# Patient Record
Sex: Female | Born: 1970 | Race: Black or African American | Hispanic: No | Marital: Married | Smoking: Former smoker
Health system: Southern US, Community
[De-identification: ages and names within clinical notes are randomized; demographics above are authoritative.]

## PROBLEM LIST (undated history)

## (undated) DIAGNOSIS — G40909 Epilepsy, unspecified, not intractable, without status epilepticus: Secondary | ICD-10-CM

## (undated) DIAGNOSIS — R569 Unspecified convulsions: Secondary | ICD-10-CM

## (undated) HISTORY — DX: Epilepsy, unspecified, not intractable, without status epilepticus: G40.909

## (undated) HISTORY — DX: Unspecified convulsions: R56.9

## (undated) HISTORY — PX: OTHER SURGICAL HISTORY: SHX169

---

## 2004-01-30 ENCOUNTER — Emergency Department (HOSPITAL_COMMUNITY): Admission: EM | Admit: 2004-01-30 | Discharge: 2004-01-30 | Payer: Self-pay | Admitting: Emergency Medicine

## 2005-04-19 IMAGING — CT CT HEAD W/O CM
1 of 2 series · 13 of 30 positions shown, 17 images · non-contrast
Comparison: none

CLINICAL DATA: New onset of seizure.
 CT BRAIN SCAN WITHOUT CONTRAST ? 01/30/04
 Moderate atrophy.  The cerebral and cerebellar parenchyma is symmetric in appearance.  The ventricles and basilar cisterns are midline without mass effect.  Negative for extra-axial fluid collections.  The mastoid air cells and sinuses are clear. 
 IMPRESSION
 Mild atrophy without acute intracranial hemorrhage or edema.

[Series 3: — · axial · 0.43mm/px · z∈[-170,-60]mm · 13 of 26 slices shown, 17 images]
[im 2/26  brain]
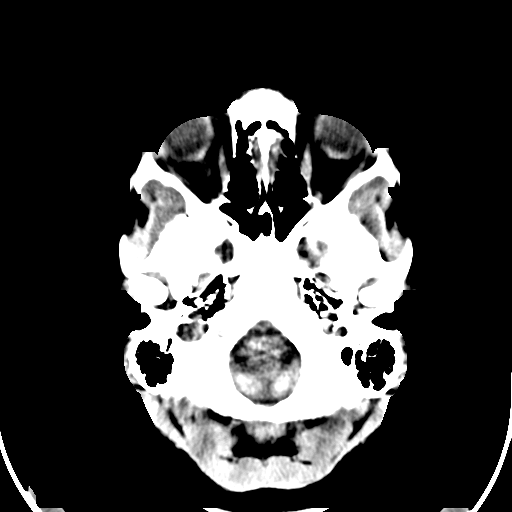
[im 2/26  bone]
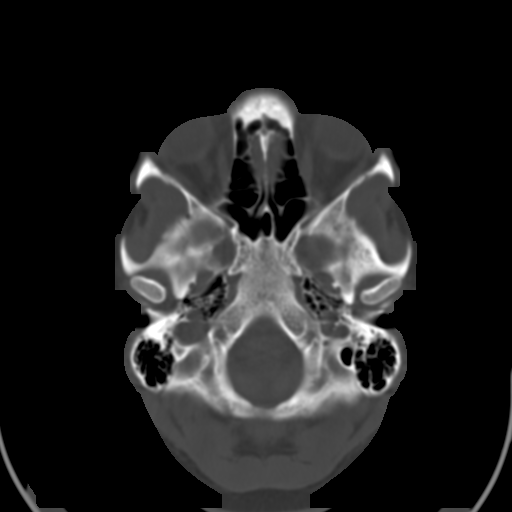
[im 4/26  brain]
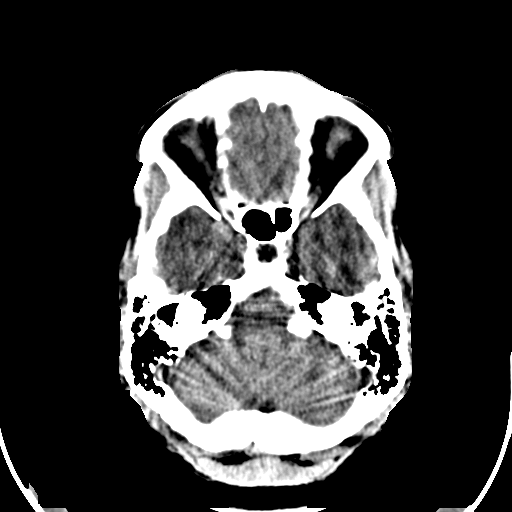
[im 6/26  brain]
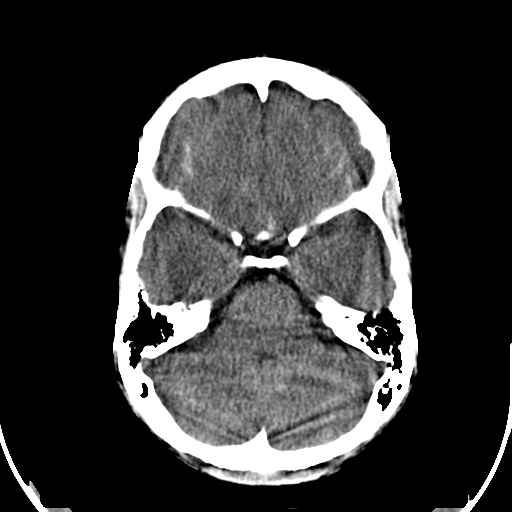
[im 8/26  brain]
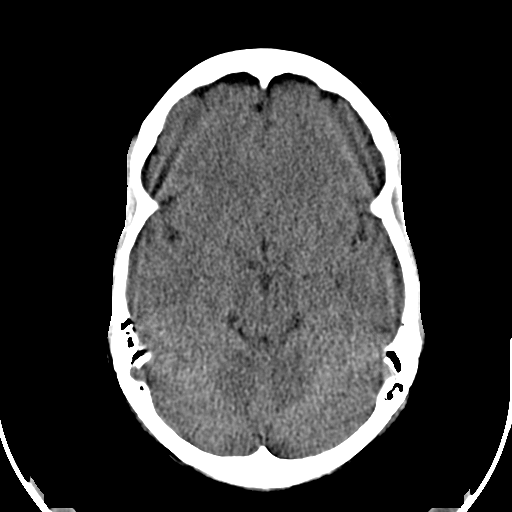
[im 9/26  brain]
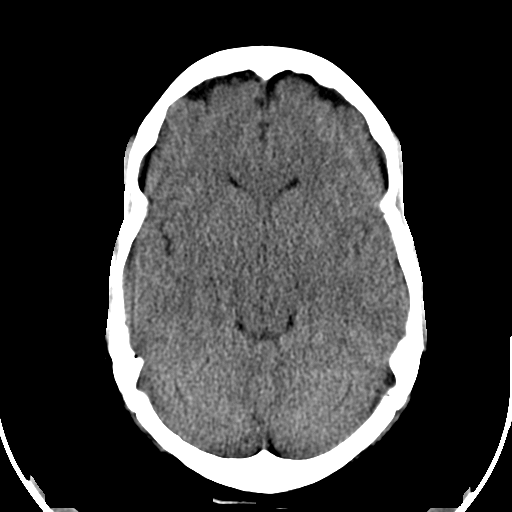
[im 9/26  bone]
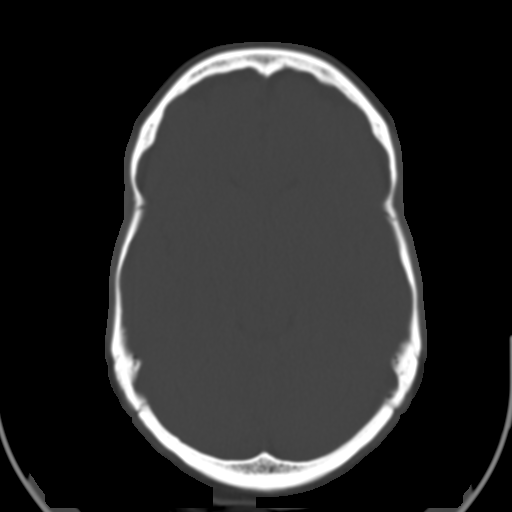
[im 11/26  brain]
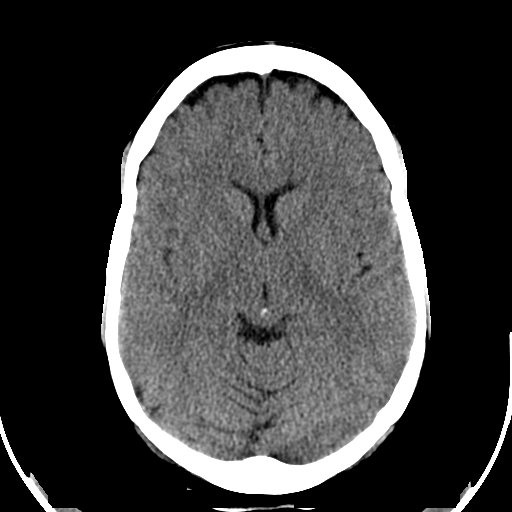
[im 13/26  brain]
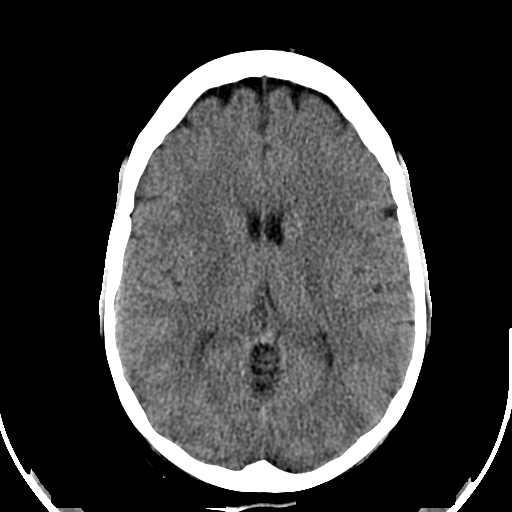
[im 15/26  brain]
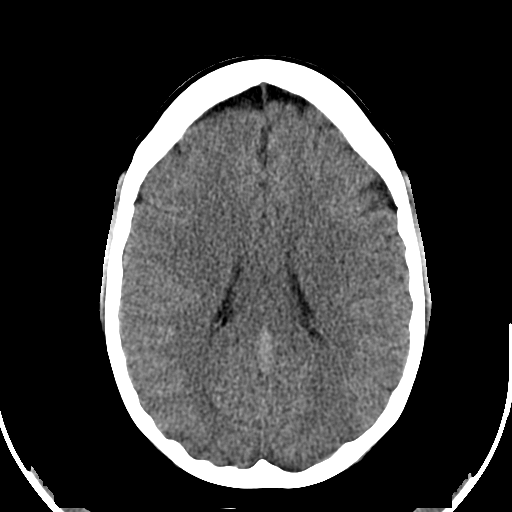
[im 17/26  brain]
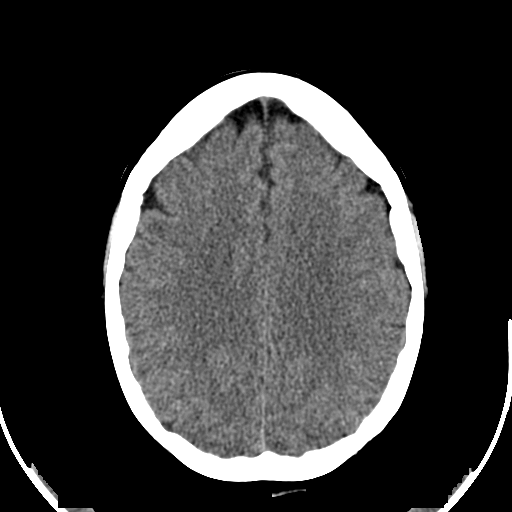
[im 17/26  bone]
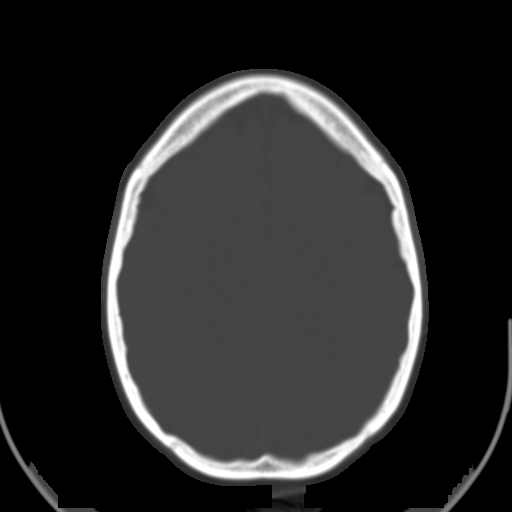
[im 18/26  brain]
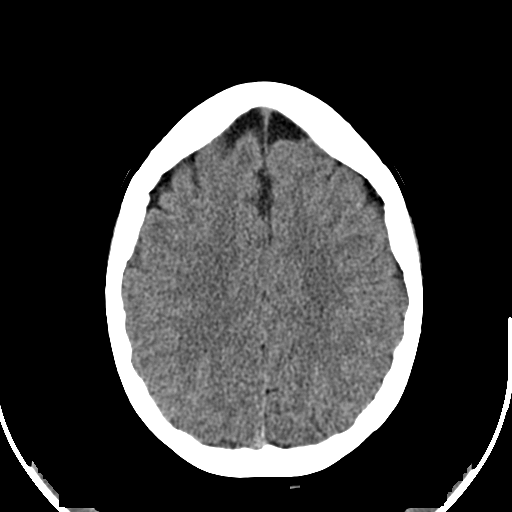
[im 20/26  brain]
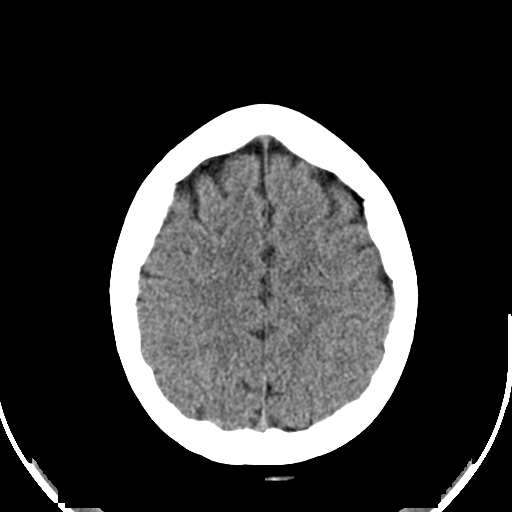
[im 22/26  brain]
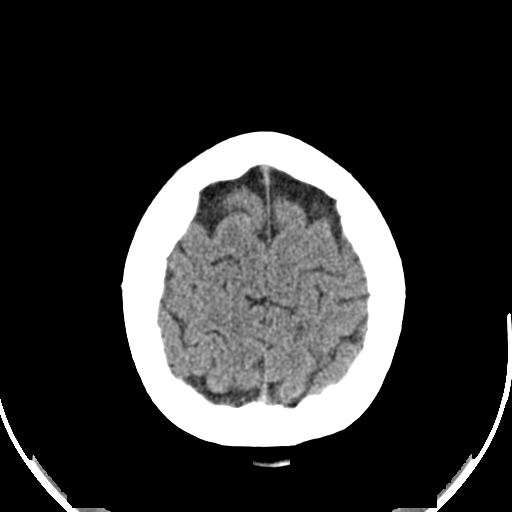
[im 24/26  brain]
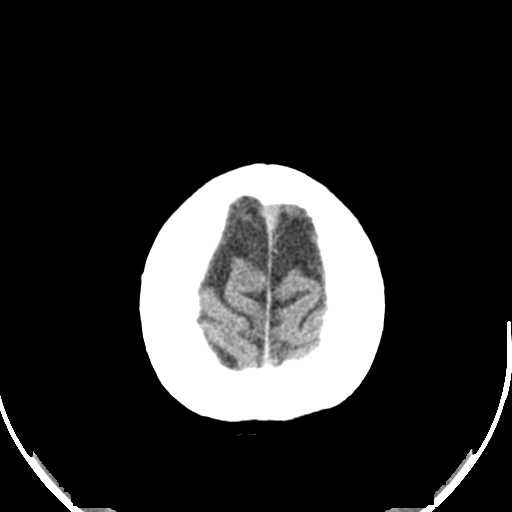
[im 24/26  bone]
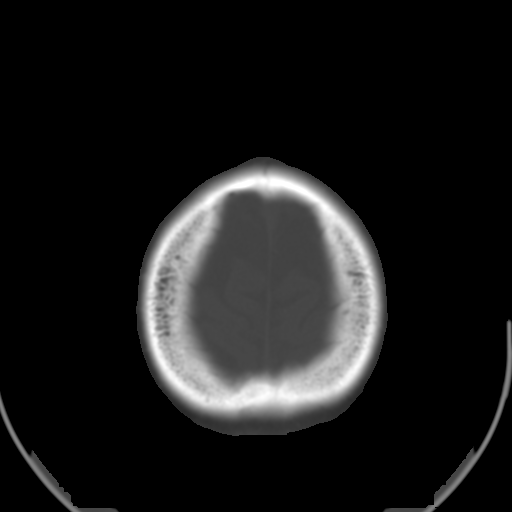

[13 of 30 positions shown; findings below may reference images not displayed]

## 2013-05-15 ENCOUNTER — Encounter: Payer: Self-pay | Admitting: Nurse Practitioner

## 2013-05-19 ENCOUNTER — Telehealth: Payer: Self-pay | Admitting: Nurse Practitioner

## 2013-05-19 NOTE — Telephone Encounter (Signed)
left message reminding patient of appointment. wed 05/21/13,couldn't reach patient wrong number,

## 2013-05-21 ENCOUNTER — Encounter: Payer: Self-pay | Admitting: Nurse Practitioner

## 2013-05-21 ENCOUNTER — Encounter (INDEPENDENT_AMBULATORY_CARE_PROVIDER_SITE_OTHER): Payer: Self-pay

## 2013-05-21 ENCOUNTER — Ambulatory Visit (INDEPENDENT_AMBULATORY_CARE_PROVIDER_SITE_OTHER): Payer: 59 | Admitting: Nurse Practitioner

## 2013-05-21 VITALS — BP 109/76 | HR 91 | Temp 97.5°F | Ht 66.0 in | Wt 186.0 lb

## 2013-05-21 DIAGNOSIS — Z79899 Other long term (current) drug therapy: Secondary | ICD-10-CM | POA: Insufficient documentation

## 2013-05-21 DIAGNOSIS — G40209 Localization-related (focal) (partial) symptomatic epilepsy and epileptic syndromes with complex partial seizures, not intractable, without status epilepticus: Secondary | ICD-10-CM

## 2013-05-21 DIAGNOSIS — G40309 Generalized idiopathic epilepsy and epileptic syndromes, not intractable, without status epilepticus: Secondary | ICD-10-CM

## 2013-05-21 MED ORDER — TOPIRAMATE 200 MG PO TABS: 200.0000 mg | ORAL_TABLET | Freq: Two times a day (BID) | ORAL | Status: DC

## 2013-05-21 NOTE — Patient Instructions (Signed)
Will check labs today Will reorder Depakote after labs back Will refill Topamax F/U yearly

## 2013-05-21 NOTE — [facility] Notes (Signed)
[facility]  PATIENT: Jacqueline [name] DOB: 15-Jan-1971   REASON FOR VISIT: Followup for seizure disorder   HISTORY OF PRESENT ILLNESS: Jacqueline [name], 42 year old female returns for followup. She was last seen on 05/22/2012. 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 MRI in the past  normal. She has had spells of funny sensations where she will lose consciousness for about 10-15 seconds. Depakote was added to her regimen in February of 2009 and increased over the ensuing months.  Her last seizure event occurred in October of 2009. She denies missing any doses of her medication, she denies sleep deprivation,  she has had no falls.  At last visit her valproic acid level was elevated and her Depakote was decreased to 1500 daily however she is only taking 1000 mg daily.. Will check level today. No primary care provider No new neurological complaints.   REVIEW OF SYSTEMS: Full 14 system review of systems performed and notable only for: all negative Constitutional: N/A  Cardiovascular: N/A  Ear/Nose/Throat: N/A  Skin: N/A  Eyes: N/A  Respiratory: N/A  Gastroitestinal: N/A  Hematology/Lymphatic: N/A  Endocrine: N/A Musculoskeletal:N/A  Allergy/Immunology: N/A  Neurological: N/A Psychiatric: N/A   ALLERGIES: No Known Allergies  HOME MEDICATIONS: Outpatient Prescriptions Prior to Visit  Medication Sig Dispense Refill  . divalproex (DEPAKOTE ER) 500 MG 24 hr tablet Take 1,000 mg by mouth daily.       Marland Kitchen topiramate (TOPAMAX) 200 MG tablet Take 200 mg by mouth 2 (two) times daily.       No facility-administered medications prior to visit.    PAST MEDICAL HISTORY: Past Medical History  Diagnosis Date  . Seizure disorder     PAST SURGICAL HISTORY: Past Surgical History  Procedure Laterality Date  . Broken nose      in high school    FAMILY HISTORY: Family History  Problem Relation Age of Onset  . Diabetes Father     SOCIAL  HISTORY: History   Social History  . Marital Status: Married    Spouse Name: [name]    Number of Children: 2  . Years of Education: college   Occupational History  . Inventory control     [employer]   Social History Main Topics  . Smoking status: Current Every Day Smoker -- 0.50 packs/day    Types: Cigarettes  . Smokeless tobacco: Never Used  . Alcohol Use: No  . Drug Use: No  . Sexual Activity: Not on file   Other Topics Concern  . Not on file   Social History Narrative   [name] and lives at home with her family.   Patient has 2 children.   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   Patient has a college education.   Patient drinks 1 cup of caffeine daily.           PHYSICAL EXAM  Filed Vitals:   05/21/13 1003  BP: 109/76  Pulse: 91  Temp: 97.5 F (36.4 C)  TempSrc: Oral  Height: 5\' 6"  (1.676 m)  Weight: 186 lb (84.369 kg)   Body mass index is 30.04 kg/(m^2).  Generalized: Well developed, in no acute distress   Neurological examination   Mentation: Alert oriented to time, place, history taking. Follows all commands speech and language fluent  Cranial nerve II-XII: Pupils were equal round reactive to light extraocular movements were full, visual field were full on confrontational test. Facial sensation and strength were normal. hearing  was intact to finger rubbing bilaterally. Uvula tongue midline. head turning and shoulder shrug and were normal and symmetric.Tongue protrusion into cheek strength was normal. Motor: normal bulk and tone, full strength in the BUE, BLE, fine finger movements normal, no pronator drift. No focal weakness Coordination: finger-nose-finger, heel-to-shin bilaterally, no dysmetria Reflexes: Brachioradialis 2/2, biceps 2/2, triceps 2/2, patellar 2/2, Achilles 2/2, plantar responses were flexor bilaterally. Gait and Station: Rising up from seated position without assistance, normal stance,   moderate stride, good arm swing, smooth turning, able to perform tiptoe, and heel walking without difficulty. Tandem gait steady  DIAGNOSTIC DATA (LABS, IMAGING, TESTING) - None to review   ASSESSMENT AND PLAN  42 y.o. year old female  has a past medical history of Seizure disorder. here to followup. No seizure activity since 2009. Last VPA level was elevated and patient has to decrease Depakote to 1500 mg daily. She is only taking 1000 mg daily. She remains on Topamax 200 mg twice daily  Will check labs today, CBC, CMP, VPA level Will reorder Depakote after labs back Will refill Topamax F/U yearly Jacqueline [name], [facility], [facility], APRN  [facility] [address] [place], [place] [address] [phone]

## 2013-05-22 ENCOUNTER — Other Ambulatory Visit: Payer: Self-pay | Admitting: Nurse Practitioner

## 2013-05-22 LAB — CBC WITH DIFFERENTIAL/PLATELET
Basophils Absolute: 0 10*3/uL (ref 0.0–0.2)
Eosinophils Absolute: 0.2 10*3/uL (ref 0.0–0.4)
HCT: 39.8 % (ref 34.0–46.6)
Hemoglobin: 13.2 g/dL (ref 11.1–15.9)
Lymphocytes Absolute: 4.3 10*3/uL — ABNORMAL HIGH (ref 0.7–3.1)
MCHC: 33.2 g/dL (ref 31.5–35.7)
MCV: 89 fL (ref 79–97)
Monocytes Absolute: 1 10*3/uL — ABNORMAL HIGH (ref 0.1–0.9)
Neutrophils Absolute: 5.8 10*3/uL (ref 1.4–7.0)
Neutrophils Relative %: 51 %
RBC: 4.47 x10E6/uL (ref 3.77–5.28)

## 2013-05-22 LAB — COMPREHENSIVE METABOLIC PANEL
Albumin/Globulin Ratio: 1.6 (ref 1.1–2.5)
Albumin: 4.5 g/dL (ref 3.5–5.5)
BUN: 8 mg/dL (ref 6–24)
CO2: 24 mmol/L (ref 18–29)
GFR calc Af Amer: 95 mL/min/{1.73_m2} (ref >59)
Globulin, Total: 2.9 g/dL (ref 1.5–4.5)
Glucose: 73 mg/dL (ref 65–99)
Sodium: 142 mmol/L (ref 134–144)
Total Bilirubin: 0.2 mg/dL (ref 0.0–1.2)
Total Protein: 7.4 g/dL (ref 6.0–8.5)

## 2013-05-22 MED ORDER — DIVALPROEX SODIUM ER 500 MG PO TB24: 1000.0000 mg | ORAL_TABLET | Freq: Every day | ORAL | Status: DC

## 2013-05-22 NOTE — Progress Notes (Signed)
Quick Note:  Spoke to patient and relayed good labs, and to continue Depakote 500mg  2 daily, per Eber Jones. ______

## 2013-06-10 ENCOUNTER — Telehealth: Payer: Self-pay

## 2013-06-10 NOTE — Telephone Encounter (Signed)
DMV Forms faxed. Copy mailed to patient.

## 2013-07-01 ENCOUNTER — Other Ambulatory Visit: Payer: Self-pay | Admitting: Neurology

## 2013-07-06 NOTE — Telephone Encounter (Signed)
Nilda RiggsNancy Carolyn Martin, NP Nilda RiggsNancy Carolyn Martin, NP      Supervision Information    Supervising Provider Type of Supervision   York Spanielharles K Willis, MD Incident To         Medication Detail      Disp Refills Start End     divalproex (DEPAKOTE ER) 500 MG 24 hr tablet 60 tablet 11 05/22/2013     Sig - Route: Take 2 tablets (1,000 mg total) by mouth daily. - Oral    E-Prescribing Status: Receipt confirmed by pharmacy (05/22/2013 9:41 AM EST)                Pharmacy    [id number] - [place], [place] - [address]

## 2013-07-07 ENCOUNTER — Telehealth: Payer: Self-pay | Admitting: Neurology

## 2013-07-07 NOTE — Telephone Encounter (Signed)
Ulis RiasYkita from [facility] - needs to verify the dosage on the Depakote prescription.  [phone].

## 2013-07-08 NOTE — Telephone Encounter (Signed)
Per last OV, patient takes 1000mg  daily.  Rx was sent when patient was here for OV.  I called the pahrmacy back.  SPoke with Nida BoatmanBrad, he said they have that Rx on file and does not see that they needed anything further.

## 2014-05-19 ENCOUNTER — Telehealth: Payer: Self-pay | Admitting: *Deleted

## 2014-05-19 NOTE — Telephone Encounter (Signed)
LMVM for pt on her cell # [phone] to reschedule appt for 05-21-14 at 1530 (if possible).

## 2014-05-19 NOTE — Telephone Encounter (Signed)
Patient returned call to [place], [place] and rescheduled appointment to 11/19 @ 3:45.  FYI

## 2014-05-21 ENCOUNTER — Ambulatory Visit: Payer: 59 | Admitting: Neurology

## 2014-05-21 DIAGNOSIS — Z0289 Encounter for other administrative examinations: Secondary | ICD-10-CM

## 2014-05-22 ENCOUNTER — Ambulatory Visit: Payer: 59 | Admitting: Neurology

## 2014-06-02 ENCOUNTER — Encounter: Payer: Self-pay | Admitting: Neurology

## 2014-06-06 ENCOUNTER — Other Ambulatory Visit: Payer: Self-pay | Admitting: Nurse Practitioner

## 2014-06-07 NOTE — Telephone Encounter (Signed)
Patient has appt next month

## 2014-06-23 ENCOUNTER — Other Ambulatory Visit: Payer: Self-pay | Admitting: Nurse Practitioner

## 2014-06-23 NOTE — Telephone Encounter (Signed)
Patient has appt scheduled in Jan  

## 2014-07-06 ENCOUNTER — Encounter: Payer: Self-pay | Admitting: Neurology

## 2014-07-06 ENCOUNTER — Ambulatory Visit (INDEPENDENT_AMBULATORY_CARE_PROVIDER_SITE_OTHER): Payer: 59 | Admitting: Neurology

## 2014-07-06 VITALS — BP 117/72 | HR 113 | Resp 16 | Ht 65.5 in | Wt 188.0 lb

## 2014-07-06 DIAGNOSIS — G40209 Localization-related (focal) (partial) symptomatic epilepsy and epileptic syndromes with complex partial seizures, not intractable, without status epilepticus: Secondary | ICD-10-CM

## 2014-07-06 DIAGNOSIS — Z5181 Encounter for therapeutic drug level monitoring: Secondary | ICD-10-CM

## 2014-07-06 MED ORDER — DIVALPROEX SODIUM ER 500 MG PO TB24: ORAL_TABLET | ORAL | Status: DC

## 2014-07-06 MED ORDER — TOPIRAMATE ER 200 MG PO CAP24: 200.0000 mg | ORAL_CAPSULE | Freq: Every evening | ORAL | Status: DC

## 2014-07-06 NOTE — Progress Notes (Signed)
Provider:   Assigned by Darrol Angel, GNP to Melvyn Novas, M D  Referring Provider:  Primary Care Physician:  Urgent care ,   Chief Complaint  Patient presents with  . RV Seizures    Rm 10, alone    HPI:  Jacqueline [name] is a 44 y.o. female  . She carries the diagnosis of seizures and has been seizure free for 8 years, since being treated at [facility] .  She has never seen me before, she reported, she was followed by Dr. Thad Ranger and Darrol Angel, [facility]   Jacqueline [name] is a right-handed African-American female who was last seen by Darrol Angel on 05-21-13. She has a history of complex partial seizures that MRIs in the past has been negative. Her spells were announced by a "funny sensation", That she could not control . The spells followed quickly after, staring and feeling "interrupted. She would miss her turn in a card game and miss parts of conversations. These very brief spells were never leading to a generalized convulsion and she never lost complete awareness of her surroundings they were not followed by any headaches bite marks unexplained bruises or a sudden degree of fatigue. Depakote was added to her medication in February 2009 in October 2090 at her last seizure.   She takes Topamax at 200 mg daily and Depakote at 1000 mg daily.  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 She gained weight over the years on Depakote, over 100 pounds. She reached a plateau     Review of Systems: Out of a complete 14 system review, the patient complains of only the following symptoms, and all other reviewed systems are negative. Weight gain,  Word finding.   History   Social History  . Marital Status: Married    Spouse Name: Jacqueline [name]    Number of Children: 2  . Years of Education: college   Occupational History  . Inventory control     [employer]   Social History Main Topics  . Smoking status: Current Every Day Smoker --  0.50 packs/day    Types: Cigarettes  . Smokeless tobacco: Never Used  . Alcohol Use: No  . Drug Use: No  . Sexual Activity: Not on file   Other Topics Concern  . Not on file   Social History Narrative   Patient is married Jacqueline [name]) and lives at home with her family.   Patient has 2 children.   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   Patient has a college education.   Patient drinks 1 cup of caffeine daily.          Family History  Problem Relation Age of Onset  . Diabetes Father     Past Medical History  Diagnosis Date  . Seizure disorder     Past Surgical History  Procedure Laterality Date  . Broken nose      in high school    Current Outpatient Prescriptions  Medication Sig Dispense Refill  . DEPAKOTE ER 500 MG 24 hr tablet TAKE 2 TABLETS (1,000 MG TOTAL) BY MOUTH DAILY. 60 tablet 0  . TOPAMAX 200 MG tablet TAKE 1 TABLET (200 MG TOTAL) BY MOUTH 2 (TWO) TIMES DAILY. (Patient taking differently: TAKE 1 TABLET (200 MG TOTAL) BY MOUTH  DAILY) 60 tablet 0   No current facility-administered medications for this visit.    Allergies as of 07/06/2014  . (No  Known Allergies)    Vitals: BP 117/72 mmHg  Pulse 113  Resp 16  Ht 5' 5.5" (1.664 m)  Wt 188 lb (85.276 kg)  BMI 30.80 kg/m2 Last Weight:  Wt Readings from Last 1 Encounters:  07/06/14 188 lb (85.276 kg)   Last Height:   Ht Readings from Last 1 Encounters:  07/06/14 5' 5.5" (1.664 m)    Physical exam:  General: The patient is awake, alert and appears not in acute distress. The patient is well groomed. Head: Normocephalic, atraumatic. Neck is supple. Mallampati 3 , neck circumference:15.25  Cardiovascular:  Regular rate and rhythm, without  murmurs or carotid bruit, and without distended neck veins. Respiratory: Lungs are clear to auscultation. Skin:  Without evidence of edema, or rash Trunk: BMI is  elevated and patient  has normal posture.  Neurologic exam : The patient is awake  and alert, oriented to place and time.  Memory subjective   described as intact. There is a normal attention span & concentration ability. Speech is fluent without  dysarthria, dysphonia or aphasia. Mood and affect are appropriate.  Cranial nerves: Pupils are equal and briskly reactive to light. Funduscopic exam without evidence of pallor or edema.  Extraocular movements  in vertical and horizontal planes intact and without nystagmus.  Visual fields by finger perimetry are intact. Hearing to finger rub intact. Facial sensation intact to fine touch. Facial motor strength is symmetric and tongue and uvula move midline. Tongue protrusion into either cheek is normal. Shoulder shrug is normal.   Motor exam: Normal tone, muscle bulk and symmetric strength in all extremities.  Sensory:  Fine touch, pinprick and vibration were tested in all extremities.  Proprioception was normal.  Coordination: Rapid alternating movements in the fingers/hands were normal. Finger-to-nose maneuver  normal without evidence of ataxia, dysmetria or tremor.  Gait and station: Patient walks without assistive device and is able unassisted to climb up to the exam table.   Deep tendon reflexes: in the  upper and lower extremities are symmetric and intact. Babinski maneuver downgoing.   Assessment:  After physical and neurologic examination, review of laboratory studies, imaging, neurophysiology testing and pre-existing records, assessment is that of :  1) well controlled seizure disorder. Appears to be secondarily generalizing.  2) weight gain from depakote.  3) wordfinding difficulties on topapax 200 mg daily , reduce to 100 mg     Plan:  Treatment plan and additional workup :  CMET., CBC> depakote level.     Porfirio Mylar Fareeha Evon MD 07/06/2014

## 2014-07-06 NOTE — Addendum Note (Signed)
Addended by: Melvyn Novas on: 07/06/2014 03:53 PM   Modules accepted: Orders, Medications

## 2014-07-07 ENCOUNTER — Other Ambulatory Visit: Payer: Self-pay | Admitting: Neurology

## 2014-07-07 LAB — COMPREHENSIVE METABOLIC PANEL
ALBUMIN: 4.5 g/dL (ref 3.5–5.5)
ALT: 25 IU/L (ref 0–32)
AST: 36 IU/L (ref 0–40)
Albumin/Globulin Ratio: 1.6 (ref 1.1–2.5)
Alkaline Phosphatase: 82 IU/L (ref 39–117)
BUN/Creatinine Ratio: 10 (ref 9–23)
BUN: 8 mg/dL (ref 6–24)
CALCIUM: 9.4 mg/dL (ref 8.7–10.2)
CHLORIDE: 106 mmol/L (ref 97–108)
CO2: 20 mmol/L (ref 18–29)
Creatinine, Ser: 0.83 mg/dL (ref 0.57–1.00)
GFR, EST AFRICAN AMERICAN: 98 mL/min/{1.73_m2} (ref >59)
GFR, EST NON AFRICAN AMERICAN: 85 mL/min/{1.73_m2} (ref >59)
GLUCOSE: 87 mg/dL (ref 65–99)
Globulin, Total: 2.9 g/dL (ref 1.5–4.5)
POTASSIUM: 4.2 mmol/L (ref 3.5–5.2)
Sodium: 143 mmol/L (ref 134–144)
TOTAL PROTEIN: 7.4 g/dL (ref 6.0–8.5)
Total Bilirubin: <0.2 mg/dL (ref 0.0–1.2)

## 2014-07-07 LAB — CBC WITH DIFFERENTIAL/PLATELET
BASOS ABS: 0.1 10*3/uL (ref 0.0–0.2)
BASOS: 1 %
Eos: 2 %
Eosinophils Absolute: 0.2 10*3/uL (ref 0.0–0.4)
HCT: 39.5 % (ref 34.0–46.6)
HEMOGLOBIN: 12.9 g/dL (ref 11.1–15.9)
IMMATURE GRANS (ABS): 0 10*3/uL (ref 0.0–0.1)
IMMATURE GRANULOCYTES: 0 %
LYMPHS: 41 %
Lymphocytes Absolute: 5.3 10*3/uL — ABNORMAL HIGH (ref 0.7–3.1)
MCH: 29.1 pg (ref 26.6–33.0)
MCHC: 32.7 g/dL (ref 31.5–35.7)
MCV: 89 fL (ref 79–97)
MONOCYTES: 8 %
Monocytes Absolute: 1 10*3/uL — ABNORMAL HIGH (ref 0.1–0.9)
NEUTROS ABS: 6.3 10*3/uL (ref 1.4–7.0)
Neutrophils Relative %: 48 %
RBC: 4.43 x10E6/uL (ref 3.77–5.28)
RDW: 15.1 % (ref 12.3–15.4)
WBC: 12.8 10*3/uL — ABNORMAL HIGH (ref 3.4–10.8)

## 2014-07-07 LAB — VALPROIC ACID LEVEL: VALPROIC ACID LVL: 71 ug/mL (ref 50–100)

## 2014-07-13 ENCOUNTER — Telehealth: Payer: Self-pay | Admitting: Neurology

## 2014-07-13 NOTE — Telephone Encounter (Signed)
Misty StanleyLisa, Pharmacist with [facility] @ [phone], questioning Rx for DEPAKOTE ER 500 MG 24 hr tablet received.  Dr. Vickey Hugerohmeier indicated DAW0 and patient normally gets brand name.  Please resubmit  RX for DEPAKOTE ER 500 MG 24 hr tablet with DAW1.  Please call and advise.

## 2014-07-13 NOTE — Telephone Encounter (Signed)
I called back.  Spoke with [place].  She said they looked at the previous Rx and realized it had not been written as BMN.  Asked that we disregard the call.

## 2014-07-22 ENCOUNTER — Encounter: Payer: Self-pay | Admitting: *Deleted

## 2014-08-06 ENCOUNTER — Other Ambulatory Visit: Payer: Self-pay | Admitting: Neurology

## 2014-08-10 ENCOUNTER — Other Ambulatory Visit: Payer: Self-pay | Admitting: Neurology

## 2014-08-10 MED ORDER — TOPIRAMATE ER 200 MG PO CAP24: 200.0000 mg | ORAL_CAPSULE | Freq: Every evening | ORAL | Status: DC

## 2014-09-24 ENCOUNTER — Telehealth: Payer: Self-pay | Admitting: Neurology

## 2014-09-24 NOTE — Telephone Encounter (Signed)
Pt is calling stating she wants to go back on the regular Topiramate and not the Topiramate ER 200 MG CP24.  She states since she changed she has had 2 or 3 seizures.  Please call and advise.

## 2014-09-24 NOTE — Telephone Encounter (Signed)
I spoke to pt and she has had seizures since the [facility] from [facility] (her previous medication) to the generic topiramate ER.  She would like to change back even though she had side effects (fogginess, word finding diff).  [facility]

## 2014-09-25 MED ORDER — TOPIRAMATE 100 MG PO TABS: 100.0000 mg | ORAL_TABLET | Freq: Two times a day (BID) | ORAL | Status: DC

## 2014-09-25 NOTE — Telephone Encounter (Signed)
Changed prescription back to 100 mg tab , bid.

## 2014-09-28 ENCOUNTER — Other Ambulatory Visit: Payer: Self-pay | Admitting: *Deleted

## 2014-09-28 ENCOUNTER — Other Ambulatory Visit: Payer: Self-pay | Admitting: Neurology

## 2014-09-28 MED ORDER — TOPIRAMATE 100 MG PO TABS: 100.0000 mg | ORAL_TABLET | Freq: Every day | ORAL | Status: DC

## 2014-09-28 NOTE — Telephone Encounter (Signed)
Patient stated Generic Topamax was received at Pharmacy on Friday and she needs name brand medication.  Please call and advise.

## 2014-09-28 NOTE — Telephone Encounter (Signed)
LMVM for pt that 100mg  po topamax daily (brand name) was e scribed for pt.

## 2014-09-28 NOTE — Progress Notes (Signed)
Order redone, due needing Brand Name Topamax.  100mg  po daily *decreased from 200mg  due to word finding difficulties from 05-07-15 note and after speaking to Dr Vickey Hugerohmeier. Pt will pick up today.

## 2014-10-26 ENCOUNTER — Telehealth: Payer: Self-pay | Admitting: *Deleted

## 2014-10-26 NOTE — Telephone Encounter (Signed)
[facility] 10/26/14.

## 2014-10-26 NOTE — Telephone Encounter (Signed)
Patient dropped off DMV form to be filled out, Alverda SkeansSandy Young, RN assisted in filling out form, form sent to medical records 10/26/2014.

## 2014-10-27 ENCOUNTER — Telehealth: Payer: Self-pay

## 2014-10-27 DIAGNOSIS — Z0289 Encounter for other administrative examinations: Secondary | ICD-10-CM

## 2014-10-27 NOTE — Telephone Encounter (Signed)
Called pt to verify information on her DMV form. Asked pt about phone calls with Alverda SkeansSandy Young, RN in 09/2014 about having her topamax changed from generic to brand name due to some seizures. Pt explained that after her visit in January that Dr. Vickey Hugerohmeier prescribed the generic of topamax and following that, she had a "small seizure" that she described as a brief disruption. After that, she called [facility] and [facility] and has not had a seizure since. Explained to pt that I was putting that information on her DMV form and would be giving it to medical records to mail today.

## 2014-10-29 ENCOUNTER — Telehealth: Payer: Self-pay

## 2014-10-29 NOTE — Telephone Encounter (Signed)
Called Jacqueline [name] to inform her that Dr. Vickey Hugerohmeier back in office and her DMV form is complete but Jacqueline [name] had not filled out her part of the application. Jacqueline [name] wants her son to pick up the form this afternoon. Will give to medical records.

## 2014-10-29 NOTE — Telephone Encounter (Signed)
Patient form is at the front desk for pick up.

## 2014-11-05 ENCOUNTER — Other Ambulatory Visit: Payer: Self-pay | Admitting: Neurology

## 2014-11-09 ENCOUNTER — Telehealth: Payer: Self-pay | Admitting: Neurology

## 2014-11-09 NOTE — Telephone Encounter (Signed)
I called and spoke to [facility], Ebony Cargolayton.   Pt went [facility] and picked up generic when she takes BN.  She was not aware until got home and noticed pills looked different.  She did not take and she had a seizure last night.  He wanted us to made aware.  Pt has received her BN depakote er now,  and he did not know if Dr. Vickey Hugerohmeier wanted her to take additional doses.  I would relay message.  I called and LMVM for pt to return call on her cell # to see how she was doing.

## 2014-11-09 NOTE — Telephone Encounter (Signed)
[name] [address] called and would like to speak with someone regarding the patient and Rx DEPAKOTE ER 500 MG 24 hr tablet. Please call and advise. If [name] is not there ask for Phil.

## 2014-11-12 NOTE — Telephone Encounter (Signed)
All pharmacy personnel is assisting other customers.Marland Kitchen.Marland Kitchen..Marland Kitchen

## 2015-01-26 ENCOUNTER — Other Ambulatory Visit: Payer: Self-pay | Admitting: Neurology

## 2015-01-28 NOTE — Telephone Encounter (Signed)
Patient calling regarding the refill of her Topamax. She got a call from the pharmacy stating that she needed to check back with her physician regarding it.  Her best call back is [phone].

## 2015-01-28 NOTE — Telephone Encounter (Signed)
I called back.  Got no answer.  Left message.  Rx sent with same instructions Weston added previously.

## 2015-02-03 ENCOUNTER — Other Ambulatory Visit: Payer: Self-pay | Admitting: Neurology

## 2015-02-08 ENCOUNTER — Telehealth: Payer: Self-pay | Admitting: Neurology

## 2015-02-08 MED ORDER — TOPAMAX 100 MG PO TABS: 100.0000 mg | ORAL_TABLET | Freq: Two times a day (BID) | ORAL | Status: DC

## 2015-02-08 NOTE — Telephone Encounter (Signed)
Pt is calling regarding TOPAMAX 100 MG tablet pharmacy has 2 prescriptions. RX is supposed to be  2 times per day

## 2015-02-08 NOTE — Telephone Encounter (Signed)
By viewing previous notes:   Melvyn Novas, MD at 09/25/2014 1:05 PM     Status: Signed       Expand All Collapse All   Changed prescription back to 100 mg tab , bid.           Guy Begin, RN at 09/28/2014 5:17 PM     Status: Signed       Expand All Collapse All   LMVM for pt that  po topamax daily (brand name) was e scribed for pt.       Rx has been resent per providers note. Called back, got no answer.  Left message.

## 2015-03-05 ENCOUNTER — Other Ambulatory Visit: Payer: Self-pay | Admitting: Neurology

## 2015-06-16 ENCOUNTER — Other Ambulatory Visit: Payer: Self-pay | Admitting: Neurology

## 2015-07-12 ENCOUNTER — Ambulatory Visit: Payer: 59 | Admitting: Nurse Practitioner

## 2015-07-19 ENCOUNTER — Other Ambulatory Visit: Payer: Self-pay | Admitting: Neurology

## 2015-07-20 ENCOUNTER — Encounter: Payer: Self-pay | Admitting: Nurse Practitioner

## 2015-07-20 ENCOUNTER — Telehealth: Payer: Self-pay | Admitting: *Deleted

## 2015-07-20 ENCOUNTER — Ambulatory Visit (INDEPENDENT_AMBULATORY_CARE_PROVIDER_SITE_OTHER): Payer: 59 | Admitting: Nurse Practitioner

## 2015-07-20 ENCOUNTER — Other Ambulatory Visit: Payer: Self-pay

## 2015-07-20 VITALS — BP 116/75 | HR 97 | Ht 65.5 in | Wt 197.4 lb

## 2015-07-20 DIAGNOSIS — G40209 Localization-related (focal) (partial) symptomatic epilepsy and epileptic syndromes with complex partial seizures, not intractable, without status epilepticus: Secondary | ICD-10-CM

## 2015-07-20 DIAGNOSIS — Z5181 Encounter for therapeutic drug level monitoring: Secondary | ICD-10-CM

## 2015-07-20 MED ORDER — TOPAMAX 100 MG PO TABS: ORAL_TABLET | ORAL | Status: DC

## 2015-07-20 NOTE — Telephone Encounter (Signed)
Patient DMV form on [employer].

## 2015-07-20 NOTE — Progress Notes (Signed)
[facility]  PATIENT: Jacqueline [name] DOB: [birth year]   REASON FOR VISIT: Follow-up for complex partial seizures HISTORY FROM: Patient    HISTORY OF PRESENT ILLNESS: Jacqueline [name], 45 year old female returns for follow-up. She was last seen in this office by Dr. Vickey Huger 07/06/2014. She has a history of complex partial seizures. She reports that her seizures may  occur during her menstrual cycle, she will have a "funny sensation" at which point she will sit down. She may have staring spell she denies any loss of consciousness. She is aware of her surroundings. There has been no evidence of generalized seizure , tongue biting bruises, incontinence etc. She has no fatigue or headache afterwards. She is currently on brand Depakote and Topamax. She returns for reevaluation and she needs labs.  HISTORY 07/06/14 Jacqueline [name] is a 45 y.o. female . She carries the diagnosis of seizures and has been seizure free for 8 years, since being treated at [facility] .  She has never seen me before, she reported, she was followed by Dr. Thad Ranger and Darrol Angel, [facility].  Jacqueline [name] is a right-handed African-American female who was last seen by Darrol Angel on 05-21-13. She has a history of complex partial seizures that MRIs in the past has been negative. Her spells were announced by a "funny sensation", That she could not control . The spells followed quickly after, staring and feeling "interrupted. She would miss her turn in a card game and miss parts of conversations. These very brief spells were never leading to a generalized convulsion and she never lost complete awareness of her surroundings they were not followed by any headaches bite marks unexplained bruises or a sudden degree of fatigue. Depakote was added to her medication in February 2009 in October 2090 at her last seizure.  She takes Topamax at 200 mg daily and Depakote at 1000 mg daily.  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 She gained weight over the years on Depakote, over 100 pounds. She reached a plateau     REVIEW OF SYSTEMS: Full 14 system review of systems performed and notable only for those listed, all others are neg:  Constitutional: neg  Cardiovascular: neg Ear/Nose/Throat: neg  Skin: neg Eyes: neg Respiratory: neg Gastroitestinal: neg  Hematology/Lymphatic: neg  Endocrine: neg Musculoskeletal:neg Allergy/Immunology: neg Neurological: Partial seizure disorder Psychiatric: neg Sleep : neg   ALLERGIES: No Known Allergies  HOME MEDICATIONS: Outpatient Prescriptions Prior to Visit  Medication Sig Dispense Refill  . DEPAKOTE ER 500 MG 24 hr tablet TAKE 2 TABLETS (1,000 MG TOTAL) BY MOUTH DAILY. 180 tablet 1  . TOPAMAX 100 MG tablet TAKE 1 TABLET BY MOUTH 2 TIMES DAILY. 60 tablet 0  . divalproex (DEPAKOTE ER) 500 MG 24 hr tablet Take 2 tabs once a day. (Patient not taking: Reported on 07/20/2015) 180 tablet 3   No facility-administered medications prior to visit.    PAST MEDICAL HISTORY: Past Medical History  Diagnosis Date  . Seizure disorder (HCC)     PAST SURGICAL HISTORY: Past Surgical History  Procedure Laterality Date  . Broken nose      in high school    FAMILY HISTORY: Family History  Problem Relation Age of Onset  . Diabetes Father     SOCIAL HISTORY: Social History   Social History  . Marital Status: Married    Spouse Name: [name]  . Number of Children: 2  . Years of  Education: college   Occupational History  . Inventory control     [employer]   Social History Main Topics  . Smoking status: Current Every Day Smoker -- 0.50 packs/day    Types: Cigarettes  . Smokeless tobacco: Never Used  . Alcohol Use: No  . Drug Use: No  . Sexual Activity: Not on file   Other Topics Concern  . Not on file   Social History Narrative   [name] and lives at home with her family.    Patient has 2 children.   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   Patient has a college education.   Patient drinks 1 cup of caffeine daily.           PHYSICAL EXAM  Filed Vitals:   07/20/15 0756  BP: 116/75  Pulse: 97  Height: 5' 5.5" (1.664 m)  Weight: 197 lb 6.4 oz (89.54 kg)   Body mass index is 32.34 kg/(m^2). General: The patient is awake, alert and appears not in acute distress. The patient is well groomed. Head: Normocephalic, atraumatic.  Neck is supple. No carotid bruit  Cardiovascular: Regular rate and rhythm, without murmurs  Skin: Without evidence of edema, or rash Trunk: BMI is elevated   Neurologic exam : The patient is awake and alert, oriented to place and time. Memory subjective described as intact. There is a normal attention span & concentration ability. Speech is fluent without dysarthria, dysphonia or aphasia. Mood and affect are appropriate. Cranial nerves:Pupils are equal and briskly reactive to light. Funduscopic exam without evidence of pallor or edema.Extraocular movements in vertical and horizontal planes intact and without nystagmus. Visual fields by finger perimetry are intact.Hearing to finger rub intact. Facial sensation intact to fine touch. Facial motor strength is symmetric and tongue and uvula move midline. Tongue protrusion into either cheek is normal. Shoulder shrug is normal.  Motor exam: Normal tone, muscle bulk and symmetric strength in all extremities. Sensory: Fine touch, pinprick and vibration were tested in all extremities. Coordination: Rapid alternating movements in the fingers/hands were normal. Finger-to-nose maneuver normal without evidence of ataxia, dysmetria or tremor. Gait and station: Normal stance, no difficulty with turns tandem gait is steady.  Deep tendon reflexes: in the upper and lower extremities are symmetric and intact. Babinski maneuver downgoing.  DIAGNOSTIC DATA (LABS, IMAGING,  TESTING) - I reviewed patient records, labs, notes, testing and imaging myself where available.  Lab Results  Component Value Date   WBC 12.8* 07/06/2014   HGB 12.9 07/06/2014   HCT 39.5 07/06/2014   MCV 89 07/06/2014      Component Value Date/Time   NA 143 07/06/2014 1604   K 4.2 07/06/2014 1604   CL 106 07/06/2014 1604   CO2 20 07/06/2014 1604   GLUCOSE 87 07/06/2014 1604   BUN 8 07/06/2014 1604   CREATININE 0.83 07/06/2014 1604   CALCIUM 9.4 07/06/2014 1604   PROT 7.4 07/06/2014 1604   ALBUMIN 4.5 07/06/2014 1604   AST 36 07/06/2014 1604   ALT 25 07/06/2014 1604   ALKPHOS 82 07/06/2014 1604   BILITOT <0.2 07/06/2014 1604   GFRNONAA 85 07/06/2014 1604   GFRAA 98 07/06/2014 1604    ASSESSMENT AND PLAN  45 y.o. year old female  has a past medical history of Seizure disorder (HCC). here to follow-up. She is currently on brand Topamax and  Brand Depakote. She has  "Spells" during her menstrual cycles where she will have a "funny sensation" and  may have a staring spell. There is no evidence of tongue biting bruising incontinence etc. These episodes last about 20 seconds or less.  PLAN:  CBC, CMP, Depakote and Topamax levels today DMV forms to be filled out Continue Depakote  current doses for now will await lab testing Will refill Topamax Follow-up in 6 months Continue to keep a record of these eventsVst time 25 min Nilda Riggs, [facility], APRN  [facility] [address], [address] [place], [place] [address] [phone]

## 2015-07-20 NOTE — Patient Instructions (Signed)
CBC, CMP, Depakote and Topamax levels today Continue Depakote and Topamax at current doses for now Follow-up in 6 months

## 2015-07-20 NOTE — [facility] Notes (Signed)
I agree with the assessment and plan as directed by NP .The patient is known to me .   Ronneisha Jett, MD  

## 2015-07-21 DIAGNOSIS — Z0289 Encounter for other administrative examinations: Secondary | ICD-10-CM

## 2015-07-21 LAB — COMPREHENSIVE METABOLIC PANEL
A/G RATIO: 1.4 (ref 1.1–2.5)
ALBUMIN: 4.1 g/dL (ref 3.5–5.5)
ALT: 20 IU/L (ref 0–32)
AST: 26 IU/L (ref 0–40)
Alkaline Phosphatase: 78 IU/L (ref 39–117)
BUN / CREAT RATIO: 8 — AB (ref 9–23)
BUN: 7 mg/dL (ref 6–24)
CALCIUM: 9.4 mg/dL (ref 8.7–10.2)
CHLORIDE: 105 mmol/L (ref 96–106)
CO2: 18 mmol/L (ref 18–29)
Creatinine, Ser: 0.83 mg/dL (ref 0.57–1.00)
GFR, EST AFRICAN AMERICAN: 98 mL/min/{1.73_m2} (ref >59)
GFR, EST NON AFRICAN AMERICAN: 85 mL/min/{1.73_m2} (ref >59)
GLOBULIN, TOTAL: 2.9 g/dL (ref 1.5–4.5)
Glucose: 111 mg/dL — ABNORMAL HIGH (ref 65–99)
POTASSIUM: 4.4 mmol/L (ref 3.5–5.2)
Sodium: 138 mmol/L (ref 134–144)
TOTAL PROTEIN: 7 g/dL (ref 6.0–8.5)

## 2015-07-21 LAB — CBC WITH DIFFERENTIAL/PLATELET
BASOS: 1 %
Basophils Absolute: 0.1 10*3/uL (ref 0.0–0.2)
EOS (ABSOLUTE): 0.2 10*3/uL (ref 0.0–0.4)
Eos: 2 %
HEMATOCRIT: 39.6 % (ref 34.0–46.6)
HEMOGLOBIN: 12.8 g/dL (ref 11.1–15.9)
IMMATURE GRANS (ABS): 0.1 10*3/uL (ref 0.0–0.1)
IMMATURE GRANULOCYTES: 1 %
LYMPHS ABS: 4.5 10*3/uL — AB (ref 0.7–3.1)
LYMPHS: 37 %
MCH: 28.9 pg (ref 26.6–33.0)
MCHC: 32.3 g/dL (ref 31.5–35.7)
MCV: 89 fL (ref 79–97)
Monocytes Absolute: 1 10*3/uL — ABNORMAL HIGH (ref 0.1–0.9)
Monocytes: 8 %
NEUTROS ABS: 6.6 10*3/uL (ref 1.4–7.0)
Neutrophils: 51 %
PLATELETS: 323 10*3/uL (ref 150–379)
RBC: 4.43 x10E6/uL (ref 3.77–5.28)
RDW: 15.2 % (ref 12.3–15.4)
WBC: 12.4 10*3/uL — ABNORMAL HIGH (ref 3.4–10.8)

## 2015-07-21 LAB — VALPROIC ACID LEVEL: Valproic Acid Lvl: 81 ug/mL (ref 50–100)

## 2015-07-21 LAB — TOPIRAMATE LEVEL: Topiramate Lvl: 18 ug/mL (ref 2.0–25.0)

## 2015-07-23 ENCOUNTER — Telehealth: Payer: Self-pay | Admitting: *Deleted

## 2015-07-23 NOTE — Telephone Encounter (Signed)
I spoke to pt and gave her the lab results.   She verbalized understanding.

## 2015-07-23 NOTE — Telephone Encounter (Signed)
-----   Message from Nilda Riggs, NP sent at 07/22/2015  5:33 PM EST ----- Labs look good both blood levels within the therapeutic range. Please call patient

## 2015-07-23 NOTE — Telephone Encounter (Signed)
LMVM for pt to return call for lab results.  When she call back you may tell her that labs looked good, both blood level within therapeutic range.

## 2015-07-28 NOTE — Telephone Encounter (Signed)
Completed.  Sent for review and signature to CM/NP.

## 2015-07-29 ENCOUNTER — Telehealth: Payer: Self-pay | Admitting: *Deleted

## 2015-07-29 NOTE — Telephone Encounter (Signed)
Form,DMV received,completed by Fernande Bras faxed 07/29/15.

## 2015-09-02 ENCOUNTER — Other Ambulatory Visit: Payer: Self-pay | Admitting: Neurology

## 2016-01-18 ENCOUNTER — Ambulatory Visit (INDEPENDENT_AMBULATORY_CARE_PROVIDER_SITE_OTHER): Payer: 59 | Admitting: Nurse Practitioner

## 2016-01-18 ENCOUNTER — Encounter: Payer: Self-pay | Admitting: Nurse Practitioner

## 2016-01-18 VITALS — BP 115/76 | HR 89 | Ht 65.5 in | Wt 193.2 lb

## 2016-01-18 DIAGNOSIS — G40209 Localization-related (focal) (partial) symptomatic epilepsy and epileptic syndromes with complex partial seizures, not intractable, without status epilepticus: Secondary | ICD-10-CM

## 2016-01-18 DIAGNOSIS — G40309 Generalized idiopathic epilepsy and epileptic syndromes, not intractable, without status epilepticus: Secondary | ICD-10-CM

## 2016-01-18 MED ORDER — TOPAMAX 100 MG PO TABS
ORAL_TABLET | ORAL | Status: DC
Start: 2016-01-18 — End: 2016-08-28

## 2016-01-18 NOTE — Patient Instructions (Addendum)
Continue Depakote current dose does not need refills Continue  Topamax 100mg  twice daily will refill Follow-up in 8 months

## 2016-01-18 NOTE — [facility] Notes (Signed)
I agree with the assessment and plan as directed by NP .The patient is known to me .   Fleming Prill, MD  

## 2016-01-18 NOTE — [facility] Notes (Signed)
I agree with the assessment and plan as directed by NP .The patient is known to me .   Karyssa Amaral, MD  

## 2016-01-18 NOTE — [facility] Notes (Signed)
[facility]  PATIENT: Jacqueline [name] DOB: 08/03/1970   REASON FOR VISIT: Follow-up for complex partial seizure disorder HISTORY FROM: Patient    HISTORY OF PRESENT ILLNESS:Ms. Jacqueline [name], 45 year old female returns for follow-up. She was last seen in this office 07/20/15.She has a history of complex partial seizures. She reports that her seizures may occur during her menstrual cycle, she will have a "funny sensation" at which point she will sit down. She may have staring spell she denies any loss of consciousness. She is aware of her surroundings. This does not happen every month. There has been no evidence of generalized seizure , tongue biting bruises, incontinence etc. She has no fatigue or headache afterwards. She is currently on brand Depakote and Topamax. Levels done after last visit were  Therapeutic. She returns for reevaluation.  HISTORY 07/06/14 CDKamara Jacqueline [name] is a 45 y.o. female . She carries the diagnosis of seizures and has been seizure free for 8 years, [facility] .  She has never seen me before, she reported, she was followed by Dr. Thad Rangereynolds and [facility].  [name] is a right-handed African-American female who was last seen by Darrol Angelarolyn Martin on 05-21-13. She has a history of complex partial seizures that MRIs in the past has been negative. Her spells were announced by a "funny sensation", That she could not control . The spells followed quickly after, staring and feeling "interrupted. She would miss her turn in a card game and miss parts of conversations. These very brief spells were never leading to a generalized convulsion and she never lost complete awareness of her surroundings they were not followed by any headaches bite marks unexplained bruises or a sudden degree of fatigue. Depakote was added to her medication in February 2009 in October 2090 at her last seizure.  She takes Topamax at 200 mg daily and Depakote at 1000 mg daily.   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 She gained weight over the years on Depakote, over 100 pounds. She reached a plateau    REVIEW OF SYSTEMS: Full 14 system review of systems performed and notable only for those listed, all others are neg:  Constitutional: neg  Cardiovascular: neg Ear/Nose/Throat: neg  Skin: neg Eyes: neg Respiratory: neg Gastroitestinal: neg  Hematology/Lymphatic: neg  Endocrine: neg Musculoskeletal:neg Allergy/Immunology: neg Neurological: Complex partial seizure disorder Psychiatric: neg Sleep : neg   ALLERGIES: No Known Allergies  HOME MEDICATIONS: Outpatient Prescriptions Prior to Visit  Medication Sig Dispense Refill  . DEPAKOTE ER 500 MG 24 hr tablet TAKE 2 TABLETS (1,000 MG TOTAL) BY MOUTH DAILY. 180 tablet 3  . TOPAMAX 100 MG tablet TAKE 1 TABLET BY MOUTH 2 TIMES DAILY.BRAND ONLY 60 tablet 6   No facility-administered medications prior to visit.    PAST MEDICAL HISTORY: Past Medical History  Diagnosis Date  . Seizure disorder (HCC)     PAST SURGICAL HISTORY: Past Surgical History  Procedure Laterality Date  . Broken nose      in high school    FAMILY HISTORY: Family History  Problem Relation Age of Onset  . Diabetes Father     SOCIAL HISTORY: Social History   Social History  . Marital Status: Married    Spouse Name: Casimiro NeedleMichael  . Number of Children: 2  . Years of Education: college   Occupational History  . Inventory control     [employer]   Social  History Main Topics  . Smoking status: Current Every Day Smoker -- 0.50 packs/day    Types: Cigarettes  . Smokeless tobacco: Never Used  . Alcohol Use: No  . Drug Use: No  . Sexual Activity: Not on file   Other Topics Concern  . Not on file   Social History Narrative   [name] and lives at home with her family.   Patient has 2 children.   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   Patient has a college education.   Patient drinks 1 cup of caffeine daily.           PHYSICAL EXAM  Filed Vitals:   01/18/16 0752  Height: 5' 5.5" (1.664 m)  Weight: 193 lb 3.2 oz (87.635 kg)   Body mass index is 31.65 kg/(m^2). General: The patient is awake, alert and appears not in acute distress. The patient is well groomed. Head: Normocephalic, atraumatic.  Neck is supple. No carotid bruit  Cardiovascular: Regular rate and rhythm, without murmurs  Skin: Without evidence of edema, or rash Trunk: BMI is elevated   Neurologic exam : The patient is awake and alert, oriented to place and time. Memory subjective described as intact. There is a normal attention span & concentration ability. Speech is fluent without dysarthria, dysphonia or aphasia. Mood and affect are appropriate. Cranial nerves:Pupils are equal and briskly reactive to light. Extraocular movements in vertical and horizontal planes intact and without nystagmus. Visual fields by finger perimetry are intact.Hearing to finger rub intact. Facial sensation intact to fine touch. Facial motor strength is symmetric and tongue and uvula move midline. Tongue protrusion into either cheek is normal. Shoulder shrug is normal.  Motor exam: Normal tone, muscle bulk and symmetric strength in all extremities. Sensory: Fine touch, pinprick and vibration were tested in all extremities. Coordination: Rapid alternating movements in the fingers/hands were normal. Finger-to-nose maneuver normal without evidence of ataxia, dysmetria or tremor. Gait and station: Normal stance, no difficulty with turns tandem gait is steady.  Deep tendon reflexes: in the upper and lower extremities are symmetric and intact. Babinski maneuver downgoing.  DIAGNOSTIC DATA (LABS, IMAGING, TESTING) - I reviewed patient records, labs, notes, testing and imaging myself where available.  Lab Results  Component Value Date   WBC  12.4* 07/20/2015   HGB 12.9 07/06/2014   HCT 39.6 07/20/2015   MCV 89 07/20/2015   PLT 323 07/20/2015      Component Value Date/Time   NA 138 07/20/2015 0832   K 4.4 07/20/2015 0832   CL 105 07/20/2015 0832   CO2 18 07/20/2015 0832   GLUCOSE 111* 07/20/2015 0832   BUN 7 07/20/2015 0832   CREATININE 0.83 07/20/2015 0832   CALCIUM 9.4 07/20/2015 0832   PROT 7.0 07/20/2015 0832   ALBUMIN 4.1 07/20/2015 0832   AST 26 07/20/2015 0832   ALT 20 07/20/2015 0832   ALKPHOS 78 07/20/2015 0832   BILITOT <0.2 07/20/2015 0832   BILITOT <0.2 07/06/2014 1604   GFRNONAA 85 07/20/2015 0832   GFRAA 98 07/20/2015 0832    ASSESSMENT AND PLAN 45 y.o. year old female  has a past medical history of Seizure disorder (HCC). here to follow-up. She is currently on brand Topamax and Brand Depakote. She has "Spells" occasionally during her menstrual cycles where she will have a "funny sensation" and may have a staring spell. There is no evidence of tongue biting bruising incontinence etc. No LOC. These episodes last about 20 seconds  or less. Depakote Topamax blood levels were therapeutic at last visit.  PLAN:  Continue Depakote current dose does not need refills Continue  Topamax 100mg  twice daily will refill Follow-up in 8 months Nilda Riggs, [facility], APRN  [facility] [address] [place], [place] [address] [phone]

## 2016-02-17 ENCOUNTER — Other Ambulatory Visit: Payer: Self-pay | Admitting: Nurse Practitioner

## 2016-08-28 ENCOUNTER — Telehealth: Payer: Self-pay | Admitting: Nurse Practitioner

## 2016-08-28 MED ORDER — TOPAMAX 100 MG PO TABS: ORAL_TABLET | ORAL | 6 refills | Status: DC

## 2016-08-28 NOTE — Telephone Encounter (Signed)
RV in 09/19/16.

## 2016-08-28 NOTE — Addendum Note (Signed)
Addended byHermenia Fiscal: Darleen Moffitt on: 08/28/2016 03:43 PM   Modules accepted: Orders

## 2016-08-28 NOTE — Telephone Encounter (Signed)
Patient called back stating [facility]  FYI

## 2016-08-28 NOTE — Telephone Encounter (Signed)
Patient called in reference to TOPAMAX 100 MG tablet per [facility] they do not have the medication and will not have it available until the end of March.  Patient is on Name brand not generic.  Try Private pharmacies per pharmacies.  Patient only has 2 pills left being the 2 she will be taking tonight.  Please call

## 2016-08-29 MED ORDER — TOPIRAMATE 100 MG PO TABS: 100.0000 mg | ORAL_TABLET | Freq: Two times a day (BID) | ORAL | 1 refills | Status: DC

## 2016-08-29 NOTE — Telephone Encounter (Signed)
Spoke to pt and relayed that per CM/NP will have to try generic.  [facility]. Pt verbalized understanding.   Understands [facility].  She understands.

## 2016-08-29 NOTE — Telephone Encounter (Signed)
She will have to try generic

## 2016-08-29 NOTE — Telephone Encounter (Addendum)
Spoke to pt on work #.  She is out of med today.  Having hard time getting due to manufacturer backorder until end of March.  She has called around everywhere.   Any idea's.  Go on generic until in, (risk for sz)?

## 2016-08-29 NOTE — Telephone Encounter (Signed)
Patient called office returning nurse's call.  Please call [phone] or [phone] (desk number at work).

## 2016-08-29 NOTE — Telephone Encounter (Signed)
LMVM for pt returning her call.   Call around to other pharmacy's.  ? Backorder from manufacturer?  We have no samples.  [facility] to find some?    [facility] does not have.

## 2016-08-29 NOTE — Addendum Note (Signed)
Addended byHermenia Fiscal: YOUNG, SANDRA on: 08/29/2016 03:02 PM   Modules accepted: Orders

## 2016-08-29 NOTE — Telephone Encounter (Signed)
Patient called office in [facility] to [facility] not having name brand Topamax, just the generic.  Patient doesn't know what to do at this point being she is unable to find a pharmacy to carry the name brand.  Please call

## 2016-09-01 ENCOUNTER — Other Ambulatory Visit: Payer: Self-pay | Admitting: Neurology

## 2016-09-19 ENCOUNTER — Ambulatory Visit (INDEPENDENT_AMBULATORY_CARE_PROVIDER_SITE_OTHER): Payer: 59 | Admitting: Nurse Practitioner

## 2016-09-19 ENCOUNTER — Encounter: Payer: Self-pay | Admitting: Nurse Practitioner

## 2016-09-19 ENCOUNTER — Encounter (INDEPENDENT_AMBULATORY_CARE_PROVIDER_SITE_OTHER): Payer: Self-pay

## 2016-09-19 VITALS — BP 115/75 | HR 98 | Ht 65.5 in | Wt 197.2 lb

## 2016-09-19 DIAGNOSIS — G40209 Localization-related (focal) (partial) symptomatic epilepsy and epileptic syndromes with complex partial seizures, not intractable, without status epilepticus: Secondary | ICD-10-CM | POA: Diagnosis not present

## 2016-09-19 DIAGNOSIS — Z5181 Encounter for therapeutic drug level monitoring: Secondary | ICD-10-CM | POA: Diagnosis not present

## 2016-09-19 NOTE — [facility] Notes (Signed)
[facility]  PATIENT: Liliyana Thobe DOB: [birth year]   REASON FOR VISIT: Follow-up for complex partial seizure disorder HISTORY FROM: Patient and husband    HISTORY OF PRESENT ILLNESS:[name], 46 year old female returns for follow-up. She was last seen in this office 01/18/16.She has a history of complex partial seizures. She reports 4 seizures since July of last year..  She reports that her seizures generally occur during her menstrual cycle, she will have a "funny sensation" at which point she will sit down. She may have staring spell she denies any loss of consciousness. She is aware of her surroundings.  There has been no evidence of generalized seizure , tongue biting bruises, incontinence etc. She has no fatigue or headache afterwards. She is currently on brand Depakote and generic Topamax because her pharmacy was unable to obtain Brand Topamax. She denies any balance issues. Her mother died of dementia 07-13-2016. She returns for reevaluation.  HISTORY 07/06/14 CDKamara Carducci is a 46 y.o. female . She carries the diagnosis of seizures and has been seizure free for 8 years, since being treated at [facility] .  She has never seen me before, she reported, she was followed by Dr. Thad Ranger and Darrol Angel, [facility].  Mrs. Lambright is a right-handed African-American female who was last seen by Darrol Angel on 05-21-13. She has a history of complex partial seizures that MRIs in the past has been negative. Her spells were announced by a "funny sensation", That she could not control . The spells followed quickly after, staring and feeling "interrupted. She would miss her turn in a card game and miss parts of conversations. These very brief spells were never leading to a generalized convulsion and she never lost complete awareness of her surroundings they were not followed by any headaches bite marks unexplained bruises or a sudden degree of fatigue. Depakote was added to her medication in  February 2009 in October 2090 at her last seizure.  She takes Topamax at 200 mg daily and Depakote at 1000 mg daily.  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 She gained weight over the years on Depakote, over 100 pounds. She reached a plateau    REVIEW OF SYSTEMS: Full 14 system review of systems performed and notable only for those listed, all others are neg:  Constitutional: neg  Cardiovascular: neg Ear/Nose/Throat: neg  Skin: neg Eyes: neg Respiratory: neg Gastroitestinal: neg  Hematology/Lymphatic: neg  Endocrine: neg Musculoskeletal:neg Allergy/Immunology: neg Neurological: Complex partial seizure disorder Psychiatric: neg Sleep : neg   ALLERGIES: No Known Allergies  HOME MEDICATIONS: Outpatient Medications Prior to Visit  Medication Sig Dispense Refill  . DEPAKOTE ER 500 MG 24 hr tablet TAKE 2 TABLETS (1,000 MG TOTAL) BY MOUTH DAILY. 180 tablet 3  . topiramate (TOPAMAX) 100 MG tablet Take 1 tablet (100 mg total) by mouth 2 (two) times daily. 60 tablet 1  . TOPAMAX 100 MG tablet TAKE 1 TABLET BY MOUTH 2 TIMES DAILY.BRAND ONLY (Patient not taking: Reported on 09/19/2016) 60 tablet 6   No facility-administered medications prior to visit.     PAST MEDICAL HISTORY: Past Medical History:  Diagnosis Date  . Seizure disorder (HCC)   . Seizures (HCC)     PAST SURGICAL HISTORY: Past Surgical History:  Procedure Laterality Date  . broken nose     in high school    FAMILY HISTORY: Family History  Problem Relation Age of Onset  . Diabetes  Father   . Seizures Brother     SOCIAL HISTORY: Social History   Social History  . Marital status: Married    Spouse name: Casimiro NeedleMichael  . Number of children: 2  . Years of education: college   Occupational History  . Inventory control     [employer]   Social History Main Topics  . Smoking status: Current Every Day Smoker    Packs/day: 0.00    Types:  E-cigarettes, Cigarettes  . Smokeless tobacco: Never Used     Comment: quit smoking february 2018  . Alcohol use No  . Drug use: No  . Sexual activity: Not on file   Other Topics Concern  . Not on file   Social History Narrative   Patient is married [name](Michael) and lives at home with her family.   Patient has 2 children.   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   Patient has a college education.   Patient drinks 1 cup of caffeine daily.           PHYSICAL EXAM  Vitals:   09/19/16 0734  BP: 115/75  Pulse: 98  Weight: 197 lb 3.2 oz (89.4 kg)  Height: 5' 5.5" (1.664 m)   Body mass index is 32.32 kg/m.   Physical Exam General: well developed, well nourished, moderately obese female seated, in no evident distress Head: head normocephalic and atraumatic. Oropharynx benign Neck: supple with no carotid  bruits Cardiovascular: regular rate and rhythm, no murmurs  Neurologic Exam Mental Status: Awake and fully alert. Oriented to place and time. Follows all commands. Speech and language normal.   Cranial Nerves: Fundoscopic exam reveals sharp disc margins. Pupils equal, briskly reactive to light. Extraocular movements full without nystagmus. Visual fields full to confrontation. Hearing intact and symmetric to finger snap. Facial sensation intact. Face, tongue, palate move normally and symmetrically. Neck flexion and extension normal.  Motor: Normal bulk and tone. Normal strength in all tested extremity muscles.No focal weakness Sensory.: intact to touch and pinprick and vibratory in the upper and lower extremities.  Coordination: Rapid alternating movements normal in all extremities. Finger-to-nose and heel-to-shin performed accurately bilaterally. Gait and Station: Arises from chair without difficulty. Stance is normal. Gait demonstrates normal stride length and balance . Able to heel, toe and tandem walk without difficulty.  Reflexes: Symmetric upper and lower. Toes  downgoing.    DIAGNOSTIC DATA (LABS, IMAGING, TESTING) - I reviewed patient records, labs, notes, testing and imaging myself where available.  Lab Results  Component Value Date   WBC 12.4 (H) 07/20/2015   HGB 12.9 07/06/2014   HCT 39.6 07/20/2015   MCV 89 07/20/2015   PLT 323 07/20/2015      Component Value Date/Time   NA 138 07/20/2015 0832   K 4.4 07/20/2015 0832   CL 105 07/20/2015 0832   CO2 18 07/20/2015 0832   GLUCOSE 111 (H) 07/20/2015 0832   BUN 7 07/20/2015 0832   CREATININE 0.83 07/20/2015 0832   CALCIUM 9.4 07/20/2015 0832   PROT 7.0 07/20/2015 0832   ALBUMIN 4.1 07/20/2015 0832   AST 26 07/20/2015 0832   ALT 20 07/20/2015 0832   ALKPHOS 78 07/20/2015 0832   BILITOT <0.2 07/20/2015 0832   GFRNONAA 85 07/20/2015 0832   GFRAA 98 07/20/2015 0832    ASSESSMENT AND PLAN 46 y.o. year old female  has a past medical history of Seizure disorder (HCC). here to follow-up. She is currently on generic  Topamax due to a  back order of BRAND drug and Brand Depakote. She has "Spells" occasionally during her menstrual cycles where she will have a "funny sensation" and may have a staring spell. There is no evidence of tongue biting bruising incontinence etc. No LOC. These episodes last about 20 seconds or less.    PLAN: Will check level of Depakote to monitor for therapeutic level as well as adverse side effects  Will check  topamax level today and compare to last level when on BRAND drug Continue Depakote current dose does not need refills Continue  Topamax 100mg  twice daily  Follow-up in 6 months Call for any seizure activity I spent 15  in total face to face time with the patient more than 50% of which was spent counseling and coordination of care, reviewing test results reviewing medications and discussing and reviewing the diagnosis of seizure disorder and checking levels not only for adverse effects at therapeutic. Made her aware this is a good time to check Topamax  level since she has been on Brand in the past. Nilda Riggs, [facility], [facility], APRN  [facility] [address] [place], [place] [address] [phone]

## 2016-09-19 NOTE — Patient Instructions (Signed)
Will check level of Depakote and topamax today Continue Depakote current dose  Continue  Topamax 100mg  twice daily  Follow-up in 6 months

## 2016-09-19 NOTE — [facility] Notes (Signed)
I agree with the assessment and plan as directed by NP .The patient is known to me . Consider repeat EEG and TPM levels.    Eara Burruel, MD

## 2016-09-21 ENCOUNTER — Telehealth: Payer: Self-pay | Admitting: *Deleted

## 2016-09-21 LAB — COMPREHENSIVE METABOLIC PANEL
ALK PHOS: 84 IU/L (ref 39–117)
ALT: 15 IU/L (ref 0–32)
AST: 27 IU/L (ref 0–40)
Albumin/Globulin Ratio: 1.3 (ref 1.2–2.2)
Albumin: 4 g/dL (ref 3.5–5.5)
BUN/Creatinine Ratio: 9 (ref 9–23)
BUN: 7 mg/dL (ref 6–24)
CHLORIDE: 110 mmol/L — AB (ref 96–106)
CO2: 18 mmol/L (ref 18–29)
Calcium: 9.4 mg/dL (ref 8.7–10.2)
Creatinine, Ser: 0.77 mg/dL (ref 0.57–1.00)
GFR calc Af Amer: 106 mL/min/{1.73_m2} (ref >59)
GFR calc non Af Amer: 92 mL/min/{1.73_m2} (ref >59)
GLUCOSE: 108 mg/dL — AB (ref 65–99)
Globulin, Total: 3 g/dL (ref 1.5–4.5)
Potassium: 4.3 mmol/L (ref 3.5–5.2)
Sodium: 142 mmol/L (ref 134–144)
Total Protein: 7 g/dL (ref 6.0–8.5)

## 2016-09-21 LAB — CBC WITH DIFFERENTIAL/PLATELET
BASOS ABS: 0.1 10*3/uL (ref 0.0–0.2)
Basos: 1 %
EOS (ABSOLUTE): 0.2 10*3/uL (ref 0.0–0.4)
Eos: 2 %
HEMOGLOBIN: 12.4 g/dL (ref 11.1–15.9)
Hematocrit: 39.2 % (ref 34.0–46.6)
IMMATURE GRANS (ABS): 0.1 10*3/uL (ref 0.0–0.1)
IMMATURE GRANULOCYTES: 1 %
LYMPHS: 43 %
Lymphocytes Absolute: 5.4 10*3/uL — ABNORMAL HIGH (ref 0.7–3.1)
MCH: 27.7 pg (ref 26.6–33.0)
MCHC: 31.6 g/dL (ref 31.5–35.7)
MCV: 88 fL (ref 79–97)
MONOCYTES: 8 %
Monocytes Absolute: 0.9 10*3/uL (ref 0.1–0.9)
NEUTROS PCT: 45 %
Neutrophils Absolute: 5.7 10*3/uL (ref 1.4–7.0)
Platelets: 355 10*3/uL (ref 150–379)
RBC: 4.48 x10E6/uL (ref 3.77–5.28)
RDW: 15.7 % — ABNORMAL HIGH (ref 12.3–15.4)
WBC: 12.4 10*3/uL — AB (ref 3.4–10.8)

## 2016-09-21 LAB — VALPROIC ACID LEVEL: VALPROIC ACID LVL: 69 ug/mL (ref 50–100)

## 2016-09-21 LAB — TOPIRAMATE LEVEL: TOPIRAMATE LVL: 7.4 ug/mL (ref 2.0–25.0)

## 2016-09-21 NOTE — Telephone Encounter (Signed)
Per Enid Skeens Martin, NP, spoke with patient and informed her that her labs are okay.  Advised her that in reviewing the Topamax level last year on BRAND her drug level was 18. On generic (due to a back log of medication) it is 7.4. This RN advised her this is therapeutic but there is a difference. Advised her VPA level is good. She inquired about her liver results; informed her those values are normal. She had no other questions, verbalized understanding, appreciation.

## 2016-09-27 ENCOUNTER — Telehealth: Payer: Self-pay | Admitting: *Deleted

## 2016-09-27 NOTE — Telephone Encounter (Signed)
LMVM for pt that need answer to question on DMV.  ? Date of last sz.

## 2016-09-28 NOTE — Telephone Encounter (Signed)
Spoke to pt and she relayed that she had 2 sz back in 07/2016 when her parent passed away.   DMV form completed and forwarded to  CM/NP for review and signature.

## 2016-09-29 NOTE — Telephone Encounter (Signed)
Signed.  To MR.  

## 2016-10-05 ENCOUNTER — Telehealth: Payer: Self-pay | Admitting: *Deleted

## 2016-10-05 NOTE — Telephone Encounter (Signed)
Pt form faxed to [facility] on 10/05/16

## 2016-10-06 DIAGNOSIS — Z0289 Encounter for other administrative examinations: Secondary | ICD-10-CM

## 2016-10-26 ENCOUNTER — Other Ambulatory Visit: Payer: Self-pay | Admitting: Nurse Practitioner

## 2017-03-21 NOTE — [facility] Notes (Signed)
[facility]  PATIENT: Jacqueline [name] DOB: 1970-07-31   REASON FOR VISIT: Follow-up for complex partial seizure disorder HISTORY FROM: Patient and husband    HISTORY OF PRESENT ILLNESS:Ms. Jacqueline [name], 46 year old female returns for follow-up. She was last seen in this office 09/19/16. She  has a history of complex partial seizures. She reports 2 seizures since last seen, both occurred during her menstrual cycle .  She reports , she will have a "funny sensation" at which point she will sit down. She may have staring spell she denies any loss of consciousness. She is aware of her surroundings.  There has been no evidence of generalized seizure , tongue biting bruises, incontinence etc. She has no fatigue or headache afterwards. She is currently on brand Depakote and Brand Topamax. She denies any balance issues. Her mother died of dementia 07-Jul-2016. She was  on generic briefly at one time  her pharmacy was out of Topamax. Blood levels drawn on generic was 7.4, previously on brand 18.0 She returns for reevaluation.  HISTORY 07/06/14 CDKamara Pilgrim is a 46 y.o. female . She carries the diagnosis of seizures and has been seizure free for 8 years, since being treated at [facility] .  She has never seen me before, she reported, she was followed by Dr. Thad Ranger and Darrol Angel, [facility].  Jacqueline [name] is a right-handed African-American female who was last seen by Darrol Angel on 05-21-13. She has a history of complex partial seizures that MRIs in the past has been negative. Her spells were announced by a "funny sensation", That she could not control . The spells followed quickly after, staring and feeling "interrupted. She would miss her turn in a card game and miss parts of conversations. These very brief spells were never leading to a generalized convulsion and she never lost complete awareness of her surroundings they were not followed by any headaches bite marks unexplained bruises or a sudden  degree of fatigue. Depakote was added to her medication in February 2009 in October 2090 at her last seizure.  She takes Topamax at 200 mg daily and Depakote at 1000 mg daily.  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 She gained weight over the years on Depakote, over 100 pounds. She reached a plateau    REVIEW OF SYSTEMS: Full 14 system review of systems performed and notable only for those listed, all others are neg:  Constitutional: neg  Cardiovascular: neg Ear/Nose/Throat: neg  Skin: neg Eyes: neg Respiratory: neg Gastroitestinal: neg  Hematology/Lymphatic: neg  Endocrine: neg Musculoskeletal:neg Allergy/Immunology: neg Neurological: Complex partial seizure disorder Psychiatric: neg Sleep : neg   ALLERGIES: No Known Allergies  HOME MEDICATIONS: Outpatient Medications Prior to Visit  Medication Sig Dispense Refill  . DEPAKOTE ER 500 MG 24 hr tablet TAKE 2 TABLETS (1,000 MG TOTAL) BY MOUTH DAILY. 180 tablet 3  . topiramate (TOPAMAX) 100 MG tablet TAKE 1 TABLET (100 MG TOTAL) BY MOUTH 2 (TWO) TIMES DAILY. 60 tablet 5   No facility-administered medications prior to visit.     PAST MEDICAL HISTORY: Past Medical History:  Diagnosis Date  . Seizure disorder (HCC)   . Seizures (HCC)     PAST SURGICAL HISTORY: Past Surgical History:  Procedure Laterality Date  . broken nose     in high school    FAMILY HISTORY: Family History  Problem Relation Age of Onset  . Diabetes Father   . Seizures Brother  SOCIAL HISTORY: Social History   Social History  . Marital status: Married    Spouse name: [name]  . Number of children: 2  . Years of education: college   Occupational History  . Inventory control     [employer]   Social History Main Topics  . Smoking status: Current Every Day Smoker    Packs/day: 0.00    Types: E-cigarettes, Cigarettes  . Smokeless tobacco: Never Used     Comment:  quit smoking february 2018  . Alcohol use No  . Drug use: No  . Sexual activity: Not on file   Other Topics Concern  . Not on file   Social History Narrative   [name] and lives at home with her family.   Patient has 2 children.   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   Patient has a college education.   Patient drinks 1 cup of caffeine daily.           PHYSICAL EXAM  Vitals:   03/22/17 0724  BP: 123/76  Pulse: (!) 103  Weight: 198 lb 6.4 oz (90 kg)  Height: 5' 5.5" (1.664 m)   Body mass index is 32.51 kg/m.   Physical Exam General: well developed, well nourished, moderately obese female seated, in no evident distress Head: head normocephalic and atraumatic. Oropharynx benign Neck: supple  Neurologic Exam Mental Status: Awake and fully alert. Oriented to place and time. Follows all commands. Speech and language normal.   Cranial Nerves:  Pupils equal, briskly reactive to light. Extraocular movements full without nystagmus. Visual fields full to confrontation. Hearing intact and symmetric to finger snap. Facial sensation intact. Face, tongue, palate move normally and symmetrically. Neck flexion and extension normal.  Motor: Normal bulk and tone. Normal strength in all tested extremity muscles.No focal weakness Sensory.: intact to touch and pinprick and vibratory in the upper and lower extremities.  Coordination: Rapid alternating movements normal in all extremities. Finger-to-nose and heel-to-shin performed accurately bilaterally. Gait and Station: Arises from chair without difficulty. Stance is normal. Gait demonstrates normal stride length and balance . Able to heel, toe and tandem walk without difficulty.  Reflexes: Symmetric upper and lower. Toes downgoing.    DIAGNOSTIC DATA (LABS, IMAGING, TESTING) - I reviewed patient records, labs, notes, testing and imaging myself where available.  Lab Results  Component Value Date   WBC  12.4 (H) 09/19/2016   HGB 12.4 09/19/2016   HCT 39.2 09/19/2016   MCV 88 09/19/2016   PLT 355 09/19/2016      Component Value Date/Time   NA 142 09/19/2016 0824   K 4.3 09/19/2016 0824   CL 110 (H) 09/19/2016 0824   CO2 18 09/19/2016 0824   GLUCOSE 108 (H) 09/19/2016 0824   BUN 7 09/19/2016 0824   CREATININE 0.77 09/19/2016 0824   CALCIUM 9.4 09/19/2016 0824   PROT 7.0 09/19/2016 0824   ALBUMIN 4.0 09/19/2016 0824   AST 27 09/19/2016 0824   ALT 15 09/19/2016 0824   ALKPHOS 84 09/19/2016 0824   BILITOT <0.2 09/19/2016 0824   GFRNONAA 92 09/19/2016 0824   GFRAA 106 09/19/2016 0824    ASSESSMENT AND PLAN 46 y.o. year old female  has a past medical history of Seizure disorder (HCC). here to follow-up. She is  on  BRAND Topamax  and Brand Depakote. She has "Spells" occasionally during her menstrual cycles where she will have a "funny sensation" and may have a staring spell. There is no  evidence of tongue biting bruising incontinence etc. No LOC. These episodes last about 20 seconds or less.    PLAN: Will check level of Depakote to monitor for therapeutic level as well as adverse side effects  Continue Depakote current dose  Will refill Continue  Topamax  twice daily Will refill Follow-up in 6 months Call for any seizure activity I spent 15  in total face to face time with the patient more than 50% of which was spent counseling and coordination of care, reviewing test results reviewing medications and discussing and reviewing the diagnosis of seizure disorder and checking levels not only for adverse effects but  Therapeutic levels.  Nilda Riggs, [facility], [facility], APRN  [facility] [address], [address] [place], [place] [address] [phone]

## 2017-03-22 ENCOUNTER — Encounter: Payer: Self-pay | Admitting: Nurse Practitioner

## 2017-03-22 ENCOUNTER — Encounter (INDEPENDENT_AMBULATORY_CARE_PROVIDER_SITE_OTHER): Payer: Self-pay

## 2017-03-22 ENCOUNTER — Ambulatory Visit (INDEPENDENT_AMBULATORY_CARE_PROVIDER_SITE_OTHER): Payer: 59 | Admitting: Nurse Practitioner

## 2017-03-22 VITALS — BP 123/76 | HR 103 | Ht 65.5 in | Wt 198.4 lb

## 2017-03-22 DIAGNOSIS — G40209 Localization-related (focal) (partial) symptomatic epilepsy and epileptic syndromes with complex partial seizures, not intractable, without status epilepticus: Secondary | ICD-10-CM

## 2017-03-22 DIAGNOSIS — Z5181 Encounter for therapeutic drug level monitoring: Secondary | ICD-10-CM | POA: Diagnosis not present

## 2017-03-22 MED ORDER — TOPIRAMATE 100 MG PO TABS: 100.0000 mg | ORAL_TABLET | Freq: Two times a day (BID) | ORAL | 6 refills | Status: DC

## 2017-03-22 MED ORDER — DEPAKOTE ER 500 MG PO TB24: ORAL_TABLET | ORAL | 3 refills | Status: DC

## 2017-03-22 NOTE — [facility] Notes (Signed)
I agree with the assessment and plan as directed by NP .The patient is known to me .   Julyanna Scholle, MD  

## 2017-03-22 NOTE — Patient Instructions (Signed)
Will check level of Depakote to monitor for therapeutic level as well as adverse side effects  Continue Depakote current dose  Will refill Continue  Topamax  twice daily Will refill Follow-up in 6 months Call for any seizure activity

## 2017-03-23 LAB — COMPREHENSIVE METABOLIC PANEL
A/G RATIO: 1.5 (ref 1.2–2.2)
ALK PHOS: 86 IU/L (ref 39–117)
ALT: 17 IU/L (ref 0–32)
AST: 26 IU/L (ref 0–40)
Albumin: 4.2 g/dL (ref 3.5–5.5)
BILIRUBIN TOTAL: 0.3 mg/dL (ref 0.0–1.2)
BUN/Creatinine Ratio: 7 — ABNORMAL LOW (ref 9–23)
BUN: 6 mg/dL (ref 6–24)
CHLORIDE: 104 mmol/L (ref 96–106)
CO2: 19 mmol/L — ABNORMAL LOW (ref 20–29)
Calcium: 9.9 mg/dL (ref 8.7–10.2)
Creatinine, Ser: 0.91 mg/dL (ref 0.57–1.00)
GFR calc Af Amer: 86 mL/min/{1.73_m2} (ref >59)
GFR calc non Af Amer: 75 mL/min/{1.73_m2} (ref >59)
GLOBULIN, TOTAL: 2.8 g/dL (ref 1.5–4.5)
Glucose: 180 mg/dL — ABNORMAL HIGH (ref 65–99)
POTASSIUM: 4.6 mmol/L (ref 3.5–5.2)
SODIUM: 139 mmol/L (ref 134–144)
Total Protein: 7 g/dL (ref 6.0–8.5)

## 2017-03-23 LAB — CBC WITH DIFFERENTIAL/PLATELET
BASOS ABS: 0 10*3/uL (ref 0.0–0.2)
Basos: 0 %
EOS (ABSOLUTE): 0.2 10*3/uL (ref 0.0–0.4)
Eos: 1 %
Hematocrit: 39.7 % (ref 34.0–46.6)
Hemoglobin: 12.8 g/dL (ref 11.1–15.9)
IMMATURE GRANS (ABS): 0.1 10*3/uL (ref 0.0–0.1)
IMMATURE GRANULOCYTES: 1 %
LYMPHS: 41 %
Lymphocytes Absolute: 5.3 10*3/uL — ABNORMAL HIGH (ref 0.7–3.1)
MCH: 28.6 pg (ref 26.6–33.0)
MCHC: 32.2 g/dL (ref 31.5–35.7)
MCV: 89 fL (ref 79–97)
MONOS ABS: 1 10*3/uL — AB (ref 0.1–0.9)
Monocytes: 8 %
NEUTROS PCT: 49 %
Neutrophils Absolute: 6.4 10*3/uL (ref 1.4–7.0)
PLATELETS: 286 10*3/uL (ref 150–379)
RBC: 4.48 x10E6/uL (ref 3.77–5.28)
RDW: 15.8 % — AB (ref 12.3–15.4)
WBC: 13.1 10*3/uL — ABNORMAL HIGH (ref 3.4–10.8)

## 2017-03-23 LAB — VALPROIC ACID LEVEL: VALPROIC ACID LVL: 60 ug/mL (ref 50–100)

## 2017-03-26 ENCOUNTER — Telehealth: Payer: Self-pay | Admitting: *Deleted

## 2017-03-26 NOTE — Telephone Encounter (Signed)
LVM requesting call back, re: lab results.  

## 2017-03-26 NOTE — Telephone Encounter (Signed)
Patient called back. Informed her that her labs stable except her glucose was elevated. Advised he that may have been in  Relation to the last time she ate and what she ate. She had no questions, verbalized understanding, appreciation.

## 2017-03-26 NOTE — Telephone Encounter (Signed)
Pt returned RN's call  Call transferred to RN °

## 2017-04-16 ENCOUNTER — Telehealth: Payer: Self-pay | Admitting: Nurse Practitioner

## 2017-08-22 ENCOUNTER — Other Ambulatory Visit: Payer: Self-pay | Admitting: *Deleted

## 2017-08-22 MED ORDER — TOPIRAMATE 100 MG PO TABS: 100.0000 mg | ORAL_TABLET | Freq: Two times a day (BID) | ORAL | 1 refills | Status: DC

## 2017-08-27 ENCOUNTER — Telehealth: Payer: Self-pay | Admitting: Nurse Practitioner

## 2017-08-27 NOTE — Telephone Encounter (Signed)
Pt states due to her employer changing insurance companies due to a change in the company the cost of pt's medications  DEPAKOTE ER 500 MG 24 hr tablet    topiramate (TOPAMAX) 100 MG tablet   Are very high.  Pt is asking for a call back to discuss her medications.

## 2017-08-28 NOTE — Telephone Encounter (Signed)
Pt insurance has changed and she is with [employer] and [facility] for her medications.  ID [id number], BIN [id number], PCN  ADV, GRP  [id number].    Pt has 2 days left of medications.  Will place an urgent request thru Cover my meds.  [facility].  Initiated PA for Tier Adjustment for both topamax and depakote ER on cover my meds.

## 2017-08-28 NOTE — Telephone Encounter (Addendum)
Received Depakote Er approved 08-28-17 thru 08-28-2018 thru [facility].  PA # [id number].

## 2017-08-29 NOTE — Telephone Encounter (Signed)
Error

## 2017-08-30 MED ORDER — TOPIRAMATE 100 MG PO TABS: 100.0000 mg | ORAL_TABLET | Freq: Two times a day (BID) | ORAL | 1 refills | Status: DC

## 2017-08-30 NOTE — Telephone Encounter (Signed)
Received approval for topamax 08-29-17 thru 08-29-2020 [facility].  PA# [id number].

## 2017-08-30 NOTE — Telephone Encounter (Signed)
Pt has called re: her medications, she is asking for a call back from [employer]

## 2017-08-30 NOTE — Addendum Note (Signed)
Addended by: Guy BeginYOUNG, Shields Pautz S on: 08/30/2017 02:09 PM   Modules accepted: Orders

## 2017-08-30 NOTE — Addendum Note (Signed)
Addended by: Guy BeginYOUNG, Tomasa Dobransky S on: 08/30/2017 04:44 PM   Modules accepted: Orders

## 2017-08-30 NOTE — Telephone Encounter (Signed)
I spoke to pt and relayed that the cost for Depakote BN is 64.51 30 day supply and the topamax BN is 200.00 for 30 day supply.  This is a tier reduction from $600/ $900.  Pt would like to stay on the depakote BM and try generic topamax.  Please advise.

## 2017-08-30 NOTE — Telephone Encounter (Signed)
Ok to switch 

## 2017-08-30 NOTE — Telephone Encounter (Signed)
[facility]ayed [facility] 100mg  po BID 30 day supply.  Prescription for Depakote there for pt already.  (she can get 30 days from the 90 day prescription if she likes).  She will see how she does and investigate if [facility] will be cheaper for her.  She will let us know how she does (if any sz).  She was appreciative and will let us know.

## 2017-09-17 NOTE — Progress Notes (Signed)
[facility]  PATIENT: Jacqueline [name] DOB: 20-Jul-1970   REASON FOR VISIT: Follow-up for complex partial seizure disorder HISTORY FROM: Patient and husband    HISTORY OF PRESENT ILLNESS:Jacqueline [name], 47 year old female returns for follow-up. She was last seen in this office 03/20/17. She  has a history of complex partial seizures. She reports  seizures  during her menstrual cycle these are not every month and less frequent than previously, .  She reports , she will have a "funny sensation" at which point she will sit down. She may have staring spell she denies any loss of consciousness. She is aware of her surroundings.  There has been no evidence of generalized seizure , tongue biting bruises, incontinence etc. She has no fatigue or headache afterwards. She is currently on brand Depakote and just switched to generic  Topamax last month due to expense.  So far she has not noticed any difference. She denies any balance issues. Her mother died of dementia 07-18-2016. She returns for reevaluation.  HISTORY 07/06/14 Jacqueline [name] is a 47 y.o. female . She carries the diagnosis of seizures and has been seizure free for 8 years, since being treated at [facility] .  She has never seen me before, she reported, she was followed by Dr. Thad Ranger and Darrol Angel, [facility]  Jacqueline [name] is a right-handed African-American female who was last seen by Darrol Angel on 05-21-13. She has a history of complex partial seizures that MRIs in the past has been negative. Her spells were announced by a "funny sensation", That she could not control . The spells followed quickly after, staring and feeling "interrupted. She would miss her turn in a card game and miss parts of conversations. These very brief spells were never leading to a generalized convulsion and she never lost complete awareness of her surroundings they were not followed by any headaches bite marks unexplained bruises or a sudden degree of fatigue.  Depakote was added to her medication in February 2009 in October 2090 at her last seizure.  She takes Topamax at 200 mg daily and Depakote at 1000 mg daily.  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 She gained weight over the years on Depakote, over 100 pounds. She reached a plateau    REVIEW OF SYSTEMS: Full 14 system review of systems performed and notable only for those listed, all others are neg:  Constitutional: neg  Cardiovascular: neg Ear/Nose/Throat: neg  Skin: neg Eyes: neg Respiratory: neg Gastroitestinal: neg  Hematology/Lymphatic: neg  Endocrine: neg Musculoskeletal:neg Allergy/Immunology: neg Neurological: Complex partial seizure disorder Psychiatric: neg Sleep : neg   ALLERGIES: No Known Allergies  HOME MEDICATIONS: Outpatient Medications Prior to Visit  Medication Sig Dispense Refill  . DEPAKOTE ER 500 MG 24 hr tablet TAKE 2 TABLETS (1,000 MG TOTAL) BY MOUTH DAILY. 180 tablet 3  . topiramate (TOPAMAX) 100 MG tablet Take 1 tablet (100 mg total) by mouth 2 (two) times daily. 60 tablet 1   No facility-administered medications prior to visit.     PAST MEDICAL HISTORY: Past Medical History:  Diagnosis Date  . Seizure disorder (HCC)   . Seizures (HCC)     PAST SURGICAL HISTORY: Past Surgical History:  Procedure Laterality Date  . broken nose     in high school    FAMILY HISTORY: Family History  Problem Relation Age of Onset  . Diabetes Father   . Seizures Brother  SOCIAL HISTORY: Social History   Socioeconomic History  . Marital status: Married    Spouse name: Casimiro NeedleMichael  . Number of children: 2  . Years of education: college  . Highest education level: Not on file  Social Needs  . Financial resource strain: Not on file  . Food insecurity - worry: Not on file  . Food insecurity - inability: Not on file  . Transportation needs - medical: Not on file  . Transportation needs -  non-medical: Not on file  Occupational History  . Occupation: Inventory control    Comment: [employer]  Tobacco Use  . Smoking status: Former Smoker    Packs/day: 0.00    Types: E-cigarettes, Cigarettes    Last attempt to quit: 08/22/2016    Years since quitting: 1.0  . Smokeless tobacco: Never Used  . Tobacco comment: quit smoking february 2018  Substance and Sexual Activity  . Alcohol use: No  . Drug use: No  . Sexual activity: Not on file  Other Topics Concern  . Not on file  Social History Narrative   Patient is married [name](Michael) and lives at home with her family.   Patient has 2 children.   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   Patient has a college education.   Patient drinks 1 cup of caffeine daily.        PHYSICAL EXAM  Vitals:   09/19/17 0737  BP: 128/74  Pulse: 96  Weight: 190 lb (86.2 kg)   Body mass index is 31.14 kg/m.   Physical Exam General: well developed, well nourished, moderately obese female seated, in no evident distress Head: head normocephalic and atraumatic. Oropharynx benign Neck: supple  Neurologic Exam Mental Status: Awake and fully alert. Oriented to place and time. Follows all commands. Speech and language normal.   Cranial Nerves:  Pupils equal, briskly reactive to light. Extraocular movements full without nystagmus. Visual fields full to confrontation. Hearing intact and symmetric to finger snap. Facial sensation intact. Face, tongue, palate move normally and symmetrically. Neck flexion and extension normal.  Motor: Normal bulk and tone. Normal strength in all tested extremity muscles.No focal weakness Sensory.: intact to touch and pinprick and vibratory in the upper and lower extremities.  Coordination: Rapid alternating movements normal in all extremities. Finger-to-nose and heel-to-shin performed accurately bilaterally. Gait and Station: Arises from chair without difficulty. Stance is normal. Gait demonstrates  normal stride length and balance . Able to heel, toe and tandem walk without difficulty.  Reflexes: Symmetric upper and lower. Toes downgoing.    DIAGNOSTIC DATA (LABS, IMAGING, TESTING) - I reviewed patient records, labs, notes, testing and imaging myself where available.  Lab Results  Component Value Date   WBC 13.1 (H) 03/22/2017   HGB 12.8 03/22/2017   HCT 39.7 03/22/2017   MCV 89 03/22/2017   PLT 286 03/22/2017      Component Value Date/Time   NA 139 03/22/2017 0816   K 4.6 03/22/2017 0816   CL 104 03/22/2017 0816   CO2 19 (L) 03/22/2017 0816   GLUCOSE 180 (H) 03/22/2017 0816   BUN 6 03/22/2017 0816   CREATININE 0.91 03/22/2017 0816   CALCIUM 9.9 03/22/2017 0816   PROT 7.0 03/22/2017 0816   ALBUMIN 4.2 03/22/2017 0816   AST 26 03/22/2017 0816   ALT 17 03/22/2017 0816   ALKPHOS 86 03/22/2017 0816   BILITOT 0.3 03/22/2017 0816   GFRNONAA 75 03/22/2017 0816   GFRAA 86 03/22/2017 0816    ASSESSMENT  AND PLAN 47 y.o. year old female  has a past medical history of Seizure disorder (HCC). here to follow-up. She is  on  generic Topamax  and Brand Depakote. She has "Spells" occasionally during her menstrual cycles where she will have a "funny sensation" and may have a staring spell. There is no evidence of tongue biting, incontinence etc. No LOC. These episodes last about 20 seconds or less.  They have become less frequent over the years.   PLAN: Will check level of Depakote to monitor for therapeutic level as well as adverse side effects We will check CBC and CMP to monitor for adverse effects  Continue Depakote current dose  Does not need refills Continue  Topamax 100mg  twice daily Will refill generic Follow-up in 6 months Call for any seizure activity I spent 15  in total face to face time with the patient more than 50% of which was spent counseling and coordination of care, reviewing test results reviewing medications and discussing and reviewing the diagnosis of  seizure disorder and checking levels not only for adverse effects but  Therapeutic levels.  Nilda Riggs, [facility], [facility], APRN  [facility] [address], [address] [place], [place] [address] [phone]

## 2017-09-19 ENCOUNTER — Ambulatory Visit: Payer: 59 | Admitting: Nurse Practitioner

## 2017-09-19 ENCOUNTER — Encounter: Payer: Self-pay | Admitting: Nurse Practitioner

## 2017-09-19 VITALS — BP 128/74 | HR 96 | Wt 190.0 lb

## 2017-09-19 DIAGNOSIS — G40309 Generalized idiopathic epilepsy and epileptic syndromes, not intractable, without status epilepticus: Secondary | ICD-10-CM

## 2017-09-19 DIAGNOSIS — G40209 Localization-related (focal) (partial) symptomatic epilepsy and epileptic syndromes with complex partial seizures, not intractable, without status epilepticus: Secondary | ICD-10-CM | POA: Diagnosis not present

## 2017-09-19 DIAGNOSIS — Z5181 Encounter for therapeutic drug level monitoring: Secondary | ICD-10-CM | POA: Diagnosis not present

## 2017-09-19 MED ORDER — TOPIRAMATE 100 MG PO TABS: 100.0000 mg | ORAL_TABLET | Freq: Two times a day (BID) | ORAL | 8 refills | Status: DC

## 2017-09-19 NOTE — [facility] Notes (Signed)
I agree with the assessment and plan as directed by NP .This epilepsy patient is known to me .   Theoden Mauch, MD

## 2017-09-19 NOTE — Patient Instructions (Signed)
Will check level of Depakote to monitor for therapeutic level as well as adverse side effects  Continue Depakote current dose   Continue  Topamax 100mg  twice daily Will refill generic Follow-up in 6 months Call for any seizure activity

## 2017-09-20 ENCOUNTER — Telehealth: Payer: Self-pay | Admitting: *Deleted

## 2017-09-20 LAB — COMPREHENSIVE METABOLIC PANEL
ALK PHOS: 91 IU/L (ref 39–117)
ALT: 23 IU/L (ref 0–32)
AST: 34 IU/L (ref 0–40)
Albumin/Globulin Ratio: 1.4 (ref 1.2–2.2)
Albumin: 4 g/dL (ref 3.5–5.5)
BUN/Creatinine Ratio: 6 — ABNORMAL LOW (ref 9–23)
BUN: 5 mg/dL — ABNORMAL LOW (ref 6–24)
CHLORIDE: 107 mmol/L — AB (ref 96–106)
CO2: 19 mmol/L — ABNORMAL LOW (ref 20–29)
Calcium: 9.2 mg/dL (ref 8.7–10.2)
Creatinine, Ser: 0.8 mg/dL (ref 0.57–1.00)
GFR calc Af Amer: 100 mL/min/{1.73_m2} (ref >59)
GFR, EST NON AFRICAN AMERICAN: 87 mL/min/{1.73_m2} (ref >59)
GLOBULIN, TOTAL: 2.8 g/dL (ref 1.5–4.5)
Glucose: 128 mg/dL — ABNORMAL HIGH (ref 65–99)
POTASSIUM: 4 mmol/L (ref 3.5–5.2)
SODIUM: 141 mmol/L (ref 134–144)
Total Protein: 6.8 g/dL (ref 6.0–8.5)

## 2017-09-20 LAB — CBC WITH DIFFERENTIAL/PLATELET
BASOS ABS: 0.1 10*3/uL (ref 0.0–0.2)
Basos: 0 %
EOS (ABSOLUTE): 0.2 10*3/uL (ref 0.0–0.4)
Eos: 1 %
Hematocrit: 40.4 % (ref 34.0–46.6)
Hemoglobin: 12.3 g/dL (ref 11.1–15.9)
IMMATURE GRANULOCYTES: 0 %
Immature Grans (Abs): 0 10*3/uL (ref 0.0–0.1)
LYMPHS: 41 %
Lymphocytes Absolute: 4.7 10*3/uL — ABNORMAL HIGH (ref 0.7–3.1)
MCH: 28.2 pg (ref 26.6–33.0)
MCHC: 30.4 g/dL — ABNORMAL LOW (ref 31.5–35.7)
MCV: 93 fL (ref 79–97)
MONOS ABS: 0.8 10*3/uL (ref 0.1–0.9)
Monocytes: 7 %
NEUTROS ABS: 5.6 10*3/uL (ref 1.4–7.0)
NEUTROS PCT: 51 %
PLATELETS: 318 10*3/uL (ref 150–379)
RBC: 4.36 x10E6/uL (ref 3.77–5.28)
RDW: 15.3 % (ref 12.3–15.4)
WBC: 11.4 10*3/uL — ABNORMAL HIGH (ref 3.4–10.8)

## 2017-09-20 LAB — VALPROIC ACID LEVEL: VALPROIC ACID LVL: 72 ug/mL (ref 50–100)

## 2017-09-20 NOTE — Telephone Encounter (Signed)
Spoke with patient and informed her that her lab results look good. She asked about her glucose level; informed her it is down quite a bit from 6 months ago. Explained that it could still be slightly elevated because her lab was not a fasting lab. She verbalized understanding, appreciation.

## 2017-10-18 ENCOUNTER — Other Ambulatory Visit: Payer: Self-pay | Admitting: Neurology

## 2017-10-18 MED ORDER — TOPIRAMATE 100 MG PO TABS: 100.0000 mg | ORAL_TABLET | Freq: Two times a day (BID) | ORAL | 3 refills | Status: DC

## 2018-04-18 ENCOUNTER — Other Ambulatory Visit: Payer: Self-pay | Admitting: Nurse Practitioner

## 2018-05-21 NOTE — [facility] Notes (Signed)
[facility]  PATIENT: Jacqueline [name] DOB: 10-23-1970   REASON FOR VISIT: Follow-up for complex partial seizure disorder HISTORY FROM: Patient     HISTORY OF PRESENT ILLNESS:Jacqueline [name], 47 year old female returns for follow-up. She was last seen in this office 09/19/17 She  has a history of complex partial seizures. She reports  seizures  during her menstrual cycle these are not every month and less frequent than previously, .  She reports , she will have a "funny sensation" at which point she will sit down. She may have staring spell she denies any loss of consciousness. She is aware of her surroundings.  There has been no evidence of generalized seizure , tongue biting bruises, incontinence etc. She has no fatigue or headache afterwards. She is currently generic Depakote.and  Topamax  due to expense.  So far she has not noticed any difference. She denies any balance issues.  She returns for reevaluation.  HISTORY 07/06/14 Jacqueline [name] is a 47 y.o. female . She carries the diagnosis of seizures and has been seizure free for 8 years, since being treated at [facility] .  She has never seen me before, she reported, she was followed by Dr. Thad Ranger and Darrol Angel, [facility].  Jacqueline [name] is a right-handed African-American female who was last seen by Darrol Angel on 05-21-13. She has a history of complex partial seizures that MRIs in the past has been negative. Her spells were announced by a "funny sensation", That she could not control . The spells followed quickly after, staring and feeling "interrupted. She would miss her turn in a card game and miss parts of conversations. These very brief spells were never leading to a generalized convulsion and she never lost complete awareness of her surroundings they were not followed by any headaches bite marks unexplained bruises or a sudden degree of fatigue. Depakote was added to her medication in February 2009 in October 2090 at her last  seizure.  She takes Topamax at 200 mg daily and Depakote at 1000 mg daily.  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 She gained weight over the years on Depakote, over 100 pounds. She reached a plateau    REVIEW OF SYSTEMS: Full 14 system review of systems performed and notable only for those listed, all others are neg:  Constitutional: neg  Cardiovascular: neg Ear/Nose/Throat: neg  Skin: neg Eyes: neg Respiratory: neg Gastroitestinal: neg  Hematology/Lymphatic: neg  Endocrine: neg Musculoskeletal:neg Allergy/Immunology: neg Neurological: Complex partial seizure disorder Psychiatric: neg Sleep : neg   ALLERGIES: No Known Allergies  HOME MEDICATIONS: Outpatient Medications Prior to Visit  Medication Sig Dispense Refill  . DEPAKOTE ER 500 MG 24 hr tablet TAKE 2 TABLETS BY MOUTH EVERY DAY 180 tablet 3  . Prenatal Vit-Fe Fumarate-FA (PRENATAL VITAMIN PO) Take by mouth. One a day vitamin takes one tablet daily    . topiramate (TOPAMAX) 100 MG tablet Take 1 tablet (100 mg total) by mouth 2 (two) times daily. 180 tablet 3   No facility-administered medications prior to visit.     PAST MEDICAL HISTORY: Past Medical History:  Diagnosis Date  . Seizure disorder (HCC)   . Seizures (HCC)     PAST SURGICAL HISTORY: Past Surgical History:  Procedure Laterality Date  . broken nose     in high school    FAMILY HISTORY: Family History  Problem Relation Age of Onset  . Diabetes Father   .  Seizures Brother     SOCIAL HISTORY: Social History   Socioeconomic History  . Marital status: Married    Spouse name: [name]  . Number of children: 2  . Years of education: college  . Highest education level: Not on file  Occupational History  . Occupation: Inventory control    Comment: [employer]  Social Needs  . Financial resource strain: Not on file  . Food insecurity:    Worry: Not on file    Inability:  Not on file  . Transportation needs:    Medical: Not on file    Non-medical: Not on file  Tobacco Use  . Smoking status: Former Smoker    Packs/day: 0.00    Types: E-cigarettes, Cigarettes    Last attempt to quit: 08/22/2016    Years since quitting: 1.7  . Smokeless tobacco: Never Used  . Tobacco comment: quit smoking february 2018  Substance and Sexual Activity  . Alcohol use: No  . Drug use: No  . Sexual activity: Not on file  Lifestyle  . Physical activity:    Days per week: Not on file    Minutes per session: Not on file  . Stress: Not on file  Relationships  . Social connections:    Talks on phone: Not on file    Gets together: Not on file    Attends religious service: Not on file    Active member of club or organization: Not on file    Attends meetings of clubs or organizations: Not on file    Relationship status: Not on file  . Intimate partner violence:    Fear of current or ex partner: Not on file    Emotionally abused: Not on file    Physically abused: Not on file    Forced sexual activity: Not on file  Other Topics Concern  . Not on file  Social History Narrative   [name] and lives at home with her family.   Patient has 2 children.   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   Patient has a college education.   Patient drinks 1 cup of caffeine daily.        PHYSICAL EXAM  Vitals:   05/22/18 0720  BP: 110/71  Pulse: 80  Weight: 190 lb 6.4 oz (86.4 kg)  Height: 5' 5.5" (1.664 m)   Body mass index is 31.2 kg/m.   Physical Exam General: well developed, well nourished, moderately obese female seated, in no evident distress Head: head normocephalic and atraumatic. Oropharynx benign Neck: supple  Neurologic Exam Mental Status: Awake and fully alert. Oriented to place and time. Follows all commands. Speech and language normal.   Cranial Nerves:  Pupils equal, briskly reactive to light. Extraocular movements full  without nystagmus. Visual fields full to confrontation. Hearing intact and symmetric to finger snap. Facial sensation intact. Face, tongue, palate move normally and symmetrically. Neck flexion and extension normal.  Motor: Normal bulk and tone. Normal strength in all tested extremity muscles.No focal weakness Sensory.: intact to touch  in the upper and lower extremities.  Coordination: Rapid alternating movements normal in all extremities. Finger-to-nose and heel-to-shin performed accurately bilaterally. Gait and Station: Arises from chair without difficulty. Stance is normal. Gait demonstrates normal stride length and balance . Able to heel, toe and tandem walk without difficulty.  Reflexes: Symmetric upper and lower. Toes downgoing.    DIAGNOSTIC DATA (LABS, IMAGING, TESTING) - I reviewed patient records, labs, notes, testing and  imaging myself where available.  Lab Results  Component Value Date   WBC 11.4 (H) 09/19/2017   HGB 12.3 09/19/2017   HCT 40.4 09/19/2017   MCV 93 09/19/2017   PLT 318 09/19/2017      Component Value Date/Time   NA 141 09/19/2017 0809   K 4.0 09/19/2017 0809   CL 107 (H) 09/19/2017 0809   CO2 19 (L) 09/19/2017 0809   GLUCOSE 128 (H) 09/19/2017 0809   BUN 5 (L) 09/19/2017 0809   CREATININE 0.80 09/19/2017 0809   CALCIUM 9.2 09/19/2017 0809   PROT 6.8 09/19/2017 0809   ALBUMIN 4.0 09/19/2017 0809   AST 34 09/19/2017 0809   ALT 23 09/19/2017 0809   ALKPHOS 91 09/19/2017 0809   BILITOT <0.2 09/19/2017 0809   GFRNONAA 87 09/19/2017 0809   GFRAA 100 09/19/2017 0809    ASSESSMENT AND PLAN 47 y.o. year old female  has a past medical history of Seizure disorder (HCC). here to follow-up. She is  on  generic Topamax  and Depakote. She has "Spells" occasionally during her menstrual cycles where she will have a "funny sensation" and may have a staring spell. There is no evidence of tongue biting, incontinence etc. No LOC. These episodes last about 20 seconds or  less.  They have become less frequent over the years.   PLAN: Will check level of Depakote to monitor for therapeutic level as well as adverse side effects We will check CBC and CMP to monitor for adverse effects  Continue Depakote current dose  Does not need refills Continue  Topamax 100mg  twice daily does not need refills Follow-up in 8 months I spent 15  in total face to face time with the patient more than 50% of which was spent counseling and coordination of care, reviewing test results reviewing medications and discussing and reviewing the diagnosis of seizure disorder and checking levels not only for adverse effects but  Therapeutic levels.  Nilda RiggsNancy Carolyn Catalino Plascencia, [facility], [facility], APRN  [facility] [address], [address] [place], [place] [address] [phone]

## 2018-05-22 ENCOUNTER — Ambulatory Visit: Payer: BLUE CROSS/BLUE SHIELD | Admitting: Nurse Practitioner

## 2018-05-22 ENCOUNTER — Encounter: Payer: Self-pay | Admitting: Nurse Practitioner

## 2018-05-22 VITALS — BP 110/71 | HR 80 | Ht 65.5 in | Wt 190.4 lb

## 2018-05-22 DIAGNOSIS — Z5181 Encounter for therapeutic drug level monitoring: Secondary | ICD-10-CM

## 2018-05-22 DIAGNOSIS — G40209 Localization-related (focal) (partial) symptomatic epilepsy and epileptic syndromes with complex partial seizures, not intractable, without status epilepticus: Secondary | ICD-10-CM

## 2018-05-22 NOTE — Patient Instructions (Signed)
Will check level of Depakote to monitor for therapeutic level as well as adverse side effects We will check CBC and CMP to monitor for adverse effects  Continue Depakote current dose  Does not need refills Continue  Topamax 100mg  twice daily does not need refills Follow-up in 8 months

## 2018-05-23 LAB — COMPREHENSIVE METABOLIC PANEL
A/G RATIO: 1.4 (ref 1.2–2.2)
ALT: 18 IU/L (ref 0–32)
AST: 25 IU/L (ref 0–40)
Albumin: 4 g/dL (ref 3.5–5.5)
Alkaline Phosphatase: 98 IU/L (ref 39–117)
BUN/Creatinine Ratio: 9 (ref 9–23)
BUN: 7 mg/dL (ref 6–24)
Bilirubin Total: <0.2 mg/dL (ref 0.0–1.2)
CALCIUM: 9.5 mg/dL (ref 8.7–10.2)
CO2: 19 mmol/L — AB (ref 20–29)
CREATININE: 0.76 mg/dL (ref 0.57–1.00)
Chloride: 105 mmol/L (ref 96–106)
GFR, EST AFRICAN AMERICAN: 107 mL/min/{1.73_m2} (ref >59)
GFR, EST NON AFRICAN AMERICAN: 92 mL/min/{1.73_m2} (ref >59)
Globulin, Total: 2.9 g/dL (ref 1.5–4.5)
Glucose: 182 mg/dL — ABNORMAL HIGH (ref 65–99)
POTASSIUM: 4.3 mmol/L (ref 3.5–5.2)
Sodium: 139 mmol/L (ref 134–144)
TOTAL PROTEIN: 6.9 g/dL (ref 6.0–8.5)

## 2018-05-23 LAB — CBC WITH DIFFERENTIAL/PLATELET
BASOS: 1 %
Basophils Absolute: 0.1 10*3/uL (ref 0.0–0.2)
EOS (ABSOLUTE): 0.2 10*3/uL (ref 0.0–0.4)
EOS: 1 %
Hematocrit: 39.2 % (ref 34.0–46.6)
Hemoglobin: 12.3 g/dL (ref 11.1–15.9)
Immature Grans (Abs): 0.1 10*3/uL (ref 0.0–0.1)
Immature Granulocytes: 1 %
LYMPHS: 42 %
Lymphocytes Absolute: 5.6 10*3/uL — ABNORMAL HIGH (ref 0.7–3.1)
MCH: 27.8 pg (ref 26.6–33.0)
MCHC: 31.4 g/dL — AB (ref 31.5–35.7)
MCV: 89 fL (ref 79–97)
Monocytes Absolute: 0.8 10*3/uL (ref 0.1–0.9)
Monocytes: 6 %
NEUTROS ABS: 6.7 10*3/uL (ref 1.4–7.0)
NEUTROS PCT: 49 %
Platelets: 362 10*3/uL (ref 150–450)
RBC: 4.42 x10E6/uL (ref 3.77–5.28)
RDW: 14.2 % (ref 12.3–15.4)
WBC: 13.5 10*3/uL — ABNORMAL HIGH (ref 3.4–10.8)

## 2018-05-23 LAB — VALPROIC ACID LEVEL: VALPROIC ACID LVL: 71 ug/mL (ref 50–100)

## 2018-05-24 ENCOUNTER — Telehealth: Payer: Self-pay | Admitting: *Deleted

## 2018-05-24 NOTE — Telephone Encounter (Signed)
Spoke with patient and informed her that her labs look good except white count is mildly elevated. She stated she has had a cold. Advised her the Depakote level is good. Patient verbalized understanding, appreciation.

## 2018-10-28 ENCOUNTER — Other Ambulatory Visit: Payer: Self-pay | Admitting: *Deleted

## 2018-10-28 MED ORDER — TOPIRAMATE 100 MG PO TABS: 100.0000 mg | ORAL_TABLET | Freq: Two times a day (BID) | ORAL | 3 refills | Status: DC

## 2019-01-21 ENCOUNTER — Ambulatory Visit: Payer: BLUE CROSS/BLUE SHIELD | Admitting: Neurology

## 2019-01-21 ENCOUNTER — Encounter: Payer: Self-pay | Admitting: Neurology

## 2019-01-21 ENCOUNTER — Other Ambulatory Visit: Payer: Self-pay

## 2019-01-21 ENCOUNTER — Telehealth: Payer: Self-pay | Admitting: Nurse Practitioner

## 2019-01-21 DIAGNOSIS — G40209 Localization-related (focal) (partial) symptomatic epilepsy and epileptic syndromes with complex partial seizures, not intractable, without status epilepticus: Secondary | ICD-10-CM | POA: Diagnosis not present

## 2019-01-21 NOTE — Patient Instructions (Signed)
Seizure, Adult A seizure is a sudden burst of abnormal electrical activity in the brain. Seizures usually last from 30 seconds to 2 minutes. They can cause many different symptoms. Usually, seizures are not harmful unless they last a long time. What are the causes? Common causes of this condition include:  Fever or infection.  Conditions that affect the brain, such as: ? A brain abnormality that you were born with. ? A brain or head injury. ? Bleeding in the brain. ? A tumor. ? Stroke. ? Brain disorders such as autism or cerebral palsy.  Low blood sugar.  Conditions that are passed from parent to child (are inherited).  Problems with substances, such as: ? Having a reaction to a drug or a medicine. ? Suddenly stopping the use of a substance (withdrawal). In some cases, the cause may not be known. A person who has repeated seizures over time without a clear cause has a condition called epilepsy. What increases the risk? You are more likely to get this condition if you have:  A family history of epilepsy.  Had a seizure in the past.  A brain disorder.  A history of head injury, lack of oxygen at birth, or strokes. What are the signs or symptoms? There are many types of seizures. The symptoms vary depending on the type of seizure you have. Examples of symptoms during a seizure include:  Shaking (convulsions).  Stiffness in the body.  Passing out (losing consciousness).  Head nodding.  Staring. Speech arrest.   Not responding to sound or touch.  Loss of bladder control and bowel control. Some people have symptoms right before and right after a seizure happens. Symptoms before a seizure may include:  Fear.  Worry (anxiety).  Feeling like you may vomit (nauseous).  Feeling like the room is spinning (vertigo).  Feeling like you saw or heard something before (dj vu).  Odd tastes or smells.  Changes in how you see. You may see flashing lights or spots.  Symptoms after a seizure happens can include:  Confusion.  Sleepiness.  Headache.  Weakness on one side of the body. How is this treated? Most seizures will stop on their own in under 5 minutes. In these cases, no treatment is needed. Seizures that last longer than 5 minutes will usually need treatment. Treatment can include:  Medicines given through an IV tube.  Avoiding things that are known to cause your seizures. These can include medicines that you take for another condition.  Medicines to treat epilepsy.  Surgery to stop the seizures. This may be needed if medicines do not help. Follow these instructions at home: Medicines  Take over-the-counter and prescription medicines only as told by your doctor.  Do not eat or drink anything that may keep your medicine from working, such as alcohol. Activity  Do not do any activities that would be dangerous if you had another seizure, like driving or swimming. Wait until your doctor says it is safe for you to do them.  If you live in the [place] ask your local DMV (department of motor vehicles) when you can drive.  Get plenty of rest. Teaching others Teach friends and family what to do when you have a seizure. They should:  Lay you on the ground.  Protect your head and body.  Loosen any tight clothing around your neck.  Turn you on your side.  Not hold you down.  Not put anything into your mouth.  Know whether or not you need emergency  care.  Stay with you until you are better.  General instructions  Contact your doctor each time you have a seizure.  Avoid anything that gives you seizures.  Keep a seizure diary. Write down: ? What you think caused each seizure. ? What you remember about each seizure.  Keep all follow-up visits as told by your doctor. This is important. Contact a doctor if:  You have another seizure.  You have seizures more often.  There is any change in what happens during your seizures.   You keep having seizures with treatment.  You have symptoms of being sick or having an infection. Get help right away if:  You have a seizure that: ? Lasts longer than 5 minutes. ? Is different than seizures you had before. ? Makes it harder to breathe. ? Happens after you hurt your head.  You have any of these symptoms after a seizure: ? Not being able to speak. ? Not being able to use a part of your body. ? Confusion. ? A bad headache.  You have two or more seizures in a row.  You do not wake up right after a seizure.  You get hurt during a seizure. These symptoms may be an emergency. Do not wait to see if the symptoms will go away. Get medical help right away. Call your local emergency services (911 in the [place] Do not drive yourself to the hospital. Summary  Seizures usually last from 30 seconds to 2 minutes. Usually, they are not harmful unless they last a long time.  Do not eat or drink anything that may keep your medicine from working, such as alcohol.  Teach friends and family what to do when you have a seizure.  Contact your doctor each time you have a seizure. This information is not intended to replace advice given to you by your health care provider. Make sure you discuss any questions you have with your health care provider. Document Released: 12/06/2007 Document Revised: 09/06/2018 Document Reviewed: 09/06/2018 Elsevier Patient Education  [place].

## 2019-01-21 NOTE — Telephone Encounter (Signed)
Called the patient and advised her that she would not need to fast for the apt today. She was appreciative

## 2019-01-21 NOTE — Progress Notes (Signed)
[facility]  PATIENT: Jacqueline [name] DOB: 04-07-1971   REASON FOR VISIT: Follow-up for complex partial seizure disorder HISTORY FROM: Patient    HISTORY OF PRESENT ILLNESS:  [name] is a meanwhile 48 year old african american female patient followed for infrequent seizures. She is now perimenopausal, last period was 2 weeks ago, spotting. She has a seizure in  May 8th-15 th, and was witnessed. Lasting 20 seconds, no fall to the ground, no tongue bite- just staring off, speech arrest, not convulsive. - its following an aura of dream-like changes of awareness.  Since her seizures were related to menstrual cycles in the past, she hopes this too will improve in menopause.  She quit smoking earlier this year, but relapsed, she smokes now 4 a day. No alcohol, no HRT or birth control pills.   She  has a history of complex partial seizures. She reports  seizures  during her menstrual cycle these are not every month and less frequent than previously, .  She reports , she will have a "funny sensation" at which point she will sit down. She may have staring spell she denies any loss of consciousness. She is aware of her surroundings.  There has been no evidence of generalized seizure , tongue biting bruises, incontinence etc. She has no fatigue or headache afterwards. She is currently generic Depakote.and  Topamax  due to expense.  So far she has not noticed any difference. She denies any balance issues.    HISTORY 07/06/14 [name] is a 48 y.o. female. She carries the diagnosis of seizures and has been seizure free for 8 years, since being treated at [facility] .  She had seizure related to her menstrual cycle.  She has never seen me before, she reported, she was followed by Dr. [name] and Jacqueline [name], [facility].  [name] is a right-handed African-American female who was last seen by Jacqueline [name] on 05-21-13. She has a history of complex partial seizures that MRIs in the  past has been negative. Her spells were announced by a "funny sensation", That she could not control . The spells followed quickly after, staring and feeling "interrupted. She would miss her turn in a card game and miss parts of conversations. These very brief spells were never leading to a generalized convulsion and she never lost complete awareness of her surroundings they were not followed by any headaches bite marks unexplained bruises or a sudden degree of fatigue. Depakote was added to her medication in February 2009 in October 2090 at her last seizure.  She takes Topamax at 200 mg daily and Depakote at 1000 mg daily.  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 She gained weight over the years on Depakote, over 100 pounds. She reached a plateau    REVIEW OF SYSTEMS: Full 14 system review of systems performed and notable only for those listed, all others are neg:   Recurrent staring attacks, spells of altered consciousness. No menopausal sleep problems.    ALLERGIES: No Known Allergies  HOME MEDICATIONS: Outpatient Medications Prior to Visit  Medication Sig Dispense Refill  . DEPAKOTE ER 500 MG 24 hr tablet TAKE 2 TABLETS BY MOUTH EVERY DAY 180 tablet 3  . Prenatal Vit-Fe Fumarate-FA (PRENATAL VITAMIN PO) Take by mouth. One a day vitamin takes one tablet daily    . topiramate (TOPAMAX) 100 MG tablet Take 1 tablet (100 mg total) by mouth  2 (two) times daily. 180 tablet 3   No facility-administered medications prior to visit.     PAST MEDICAL HISTORY: Past Medical History:  Diagnosis Date  . Seizure disorder (HCC)   . Seizures (HCC)     PAST SURGICAL HISTORY: Past Surgical History:  Procedure Laterality Date  . broken nose     in high school    FAMILY HISTORY: Family History  Problem Relation Age of Onset  . Diabetes Father   . Seizures Brother     SOCIAL HISTORY: Social History   Socioeconomic History   . Marital status: Married    Spouse name: Jacqueline NeedleMichael  . Number of children: 2  . Years of education: college  . Highest education level: Not on file  Occupational History  . Occupation: Inventory control    Comment: [employer]  Social Needs  . Financial resource strain: Not on file  . Food insecurity    Worry: Not on file    Inability: Not on file  . Transportation needs    Medical: Not on file    Non-medical: Not on file  Tobacco Use  . Smoking status: Former Smoker    Packs/day: 0.00    Types: E-cigarettes, Cigarettes    Quit date: 08/22/2016    Years since quitting: 2.4  . Smokeless tobacco: Never Used  . Tobacco comment: quit smoking february 2018  Substance and Sexual Activity  . Alcohol use: No  . Drug use: No  . Sexual activity: Not on file  Lifestyle  . Physical activity    Days per week: Not on file    Minutes per session: Not on file  . Stress: Not on file  Relationships  . Social Musicianconnections    Talks on phone: Not on file    Gets together: Not on file    Attends religious service: Not on file    Active member of club or organization: Not on file    Attends meetings of clubs or organizations: Not on file    Relationship status: Not on file  . Intimate partner violence    Fear of current or ex partner: Not on file    Emotionally abused: Not on file    Physically abused: Not on file    Forced sexual activity: Not on file  Other Topics Concern  . Not on file  Social History Narrative   Patient is married Jacqueline Needle([name]) and lives at home with her family.   Patient has 2 children.   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   Patient has a college education.   Patient drinks 1 cup of caffeine daily.        PHYSICAL EXAM  Vitals:   01/21/19 1531  BP: 115/75  Pulse: 99  Temp: 97.7 F (36.5 C)  Weight: 183 lb (83 kg)  Height: 5\' 5"  (1.651 m)   Body mass index is 30.45 kg/m.   Physical Exam General: well developed, well nourished,  moderately obese female seated, in no evident distress Head: head normocephalic and atraumatic. Oropharynx benign Neck: supple, full ROM.    Neurologic Exam Mental Status: Awake and fully alert. Oriented to place and time. Follows all commands. Speech and language are normal. Eloquent .   No loss of taste or smell/ Vision 20/20 .   Cranial Nerves: Pupils equal in size and bilaterally briskly reactive to light. Extraocular movements full without nystagmus. Visual fields full to confrontation. Hearing intact and symmetric to finger snap.no tinnitus, no vertigo.  Facial sensation intact. Face, tongue, palate move normally and symmetrically. Neck flexion and extension normal.   Motor: Normal bulk and tone. Normal strength in all tested extremity muscles.No focal weakness.  Sensory.: intact to touch  in the upper and lower extremities.  Coordination: Rapid alternating movements normal in all extremities. Finger-to-nose n performed accurately bilaterally.  Gait and Station: Arises from being seated in a chair without difficulty. Not bracing. Stance is normal in width and stabile. . Gait demonstrates normal stride length and balance .  Able to heel, toe and tandem walk without difficulty.  Reflexes: Symmetric upper and lower. Toes downgoing.    DIAGNOSTIC DATA (LABS, IMAGING, TESTING) - I reviewed patient records, labs, notes, testing and imaging myself where available.  Lab Results  Component Value Date   WBC 13.5 (H) 05/22/2018   HGB 12.3 05/22/2018   HCT 39.2 05/22/2018   MCV 89 05/22/2018   PLT 362 05/22/2018      Component Value Date/Time   NA 139 05/22/2018 0813   K 4.3 05/22/2018 0813   CL 105 05/22/2018 0813   CO2 19 (L) 05/22/2018 0813   GLUCOSE 182 (H) 05/22/2018 0813   BUN 7 05/22/2018 0813   CREATININE 0.76 05/22/2018 0813   CALCIUM 9.5 05/22/2018 0813   PROT 6.9 05/22/2018 0813   ALBUMIN 4.0 05/22/2018 0813   AST 25 05/22/2018 0813   ALT 18 05/22/2018 0813    ALKPHOS 98 05/22/2018 0813   BILITOT <0.2 05/22/2018 0813   GFRNONAA 92 05/22/2018 0813   GFRAA 107 05/22/2018 0813    ASSESSMENT AND PLAN 48 y.o. year old female  has a past medical history of Seizure disorder (HCC). here to follow-up. She is  on  generic Topamax  and Depakote. She has "Spells" occasionally during her menstrual cycles where she will have a "funny sensation" and may have a staring spell. There is no evidence of tongue biting, incontinence etc. No LOC. These episodes last about 20 seconds or less.  They have become less frequent over the years. She is taking prenatal vitamins for prevention of neuropathy .    PLAN: Will check level of Depakote to monitor for therapeutic level as well as adverse side effects We will check CBC and CMP to monitor for adverse effects  Continue Depakote current dose / Continue Topamax 100 mg twice daily.  Follow-up in 12 months with NP  I spent 20 minutes  in total face to face time with the patient more than 50% of which was spent counseling and coordination of care, reviewing test results reviewing medications and discussing and reviewing the diagnosis of seizure disorder and checking levels not only for adverse effects but  Therapeutic levels.     [facility]armen Haille Pardi   [facility] [address], [address] [place], [place] [id number] [phone]

## 2019-01-21 NOTE — Telephone Encounter (Signed)
Pt is asking if she needs to fast for her appointment today

## 2019-04-16 ENCOUNTER — Other Ambulatory Visit: Payer: Self-pay | Admitting: Neurology

## 2019-07-23 ENCOUNTER — Other Ambulatory Visit: Payer: Self-pay | Admitting: Neurology

## 2019-09-09 ENCOUNTER — Telehealth: Payer: Self-pay | Admitting: Neurology

## 2019-09-09 NOTE — Telephone Encounter (Signed)
Well controlled Epilepsy doesn't warrant an excuse form jury duty. She can ask her PCP if she has other reasons, but epilepsy is not one of them. CD

## 2019-09-09 NOTE — Telephone Encounter (Signed)
Pt called stating that she has jury duty and she would like a letter to excuse her due to her epilepsy. Please advise.

## 2019-09-10 NOTE — Telephone Encounter (Signed)
I called pt and gave her Dr. Vickey Huger recommendation. I stated per her note "well controlled epilepsy does not warrant an excuse from jury duty.I stated she can ask her PCP if she has other medical reasons. Pt verbalized understanding.

## 2019-10-09 ENCOUNTER — Telehealth: Payer: Self-pay | Admitting: Neurology

## 2019-10-09 NOTE — Telephone Encounter (Signed)
Patient called wanting to know if her   2nd covid vaccine will affect her apt on 4/21

## 2019-10-09 NOTE — Telephone Encounter (Signed)
Called the patient back to advise that the covid vaccine should not affect her being able to come in and have her apt. Patient states that her mother was told that after receiving the 2nd shot it could cause abnormal readings in certain lab studies. I advised the patient I have not been told of this. Informed the patient that I feel that with the particular lab work that we usually check I didn't feel that this would be an issue. I did advise the pt that I would clarify this with Dr Vickey Huger and see if she feels we need to change apt. Informed the pt that if I don't call back then for her to proceed as planned for her apt. Advised her that I would call and reschedule her apt if Dr Vickey Huger thinks that is necessary. Pt 2nd shot is April 12 and apt here is set for the 21st. Spoke with dr Dohmeier and she agrees that it would be safe for the patient to keep apt.

## 2019-10-22 ENCOUNTER — Other Ambulatory Visit: Payer: Self-pay

## 2019-10-22 ENCOUNTER — Ambulatory Visit: Payer: BC Managed Care – PPO | Admitting: Family Medicine

## 2019-10-22 ENCOUNTER — Encounter: Payer: Self-pay | Admitting: Family Medicine

## 2019-10-22 VITALS — BP 109/66 | HR 81 | Temp 97.1°F | Ht 65.0 in | Wt 174.0 lb

## 2019-10-22 DIAGNOSIS — G40209 Localization-related (focal) (partial) symptomatic epilepsy and epileptic syndromes with complex partial seizures, not intractable, without status epilepticus: Secondary | ICD-10-CM

## 2019-10-22 MED ORDER — DEPAKOTE ER 500 MG PO TB24: 1000.0000 mg | ORAL_TABLET | Freq: Every day | ORAL | 3 refills | Status: DC

## 2019-10-22 MED ORDER — TOPIRAMATE 100 MG PO TABS: 100.0000 mg | ORAL_TABLET | Freq: Two times a day (BID) | ORAL | 3 refills | Status: DC

## 2019-10-22 MED ORDER — LORAZEPAM 0.5 MG PO TABS: 0.5000 mg | ORAL_TABLET | Freq: Every day | ORAL | 0 refills | Status: DC

## 2019-10-22 NOTE — Patient Instructions (Signed)
Continue divalproex 1000mg  daily and topiramate 100mg  twice daily  Stay well hydrated  Follow up in 1 year, sooner if needed   Seizure, Adult A seizure is a sudden burst of abnormal electrical activity in the brain. Seizures usually last from 30 seconds to 2 minutes. They can cause many different symptoms. Usually, seizures are not harmful unless they last a long time. What are the causes? Common causes of this condition include:  Fever or infection.  Conditions that affect the brain, such as: ? A brain abnormality that you were born with. ? A brain or head injury. ? Bleeding in the brain. ? A tumor. ? Stroke. ? Brain disorders such as autism or cerebral palsy.  Low blood sugar.  Conditions that are passed from parent to child (are inherited).  Problems with substances, such as: ? Having a reaction to a drug or a medicine. ? Suddenly stopping the use of a substance (withdrawal). In some cases, the cause may not be known. A person who has repeated seizures over time without a clear cause has a condition called epilepsy. What increases the risk? You are more likely to get this condition if you have:  A family history of epilepsy.  Had a seizure in the past.  A brain disorder.  A history of head injury, lack of oxygen at birth, or strokes. What are the signs or symptoms? There are many types of seizures. The symptoms vary depending on the type of seizure you have. Examples of symptoms during a seizure include:  Shaking (convulsions).  Stiffness in the body.  Passing out (losing consciousness).  Head nodding.  Staring.  Not responding to sound or touch.  Loss of bladder control and bowel control. Some people have symptoms right before and right after a seizure happens. Symptoms before a seizure may include:  Fear.  Worry (anxiety).  Feeling like you may vomit (nauseous).  Feeling like the room is spinning (vertigo).  Feeling like you saw or heard  something before (dj vu).  Odd tastes or smells.  Changes in how you see. You may see flashing lights or spots. Symptoms after a seizure happens can include:  Confusion.  Sleepiness.  Headache.  Weakness on one side of the body. How is this treated? Most seizures will stop on their own in under 5 minutes. In these cases, no treatment is needed. Seizures that last longer than 5 minutes will usually need treatment. Treatment can include:  Medicines given through an IV tube.  Avoiding things that are known to cause your seizures. These can include medicines that you take for another condition.  Medicines to treat epilepsy.  Surgery to stop the seizures. This may be needed if medicines do not help. Follow these instructions at home: Medicines  Take over-the-counter and prescription medicines only as told by your doctor.  Do not eat or drink anything that may keep your medicine from working, such as alcohol. Activity  Do not do any activities that would be dangerous if you had another seizure, like driving or swimming. Wait until your doctor says it is safe for you to do them.  If you live in the [place] ask your local DMV (department of motor vehicles) when you can drive.  Get plenty of rest. Teaching others Teach friends and family what to do when you have a seizure. They should:  Lay you on the ground.  Protect your head and body.  Loosen any tight clothing around your neck.  Turn you on your  side.  Not hold you down.  Not put anything into your mouth.  Know whether or not you need emergency care.  Stay with you until you are better.  General instructions  Contact your doctor each time you have a seizure.  Avoid anything that gives you seizures.  Keep a seizure diary. Write down: ? What you think caused each seizure. ? What you remember about each seizure.  Keep all follow-up visits as told by your doctor. This is important. Contact a doctor if:  You  have another seizure.  You have seizures more often.  There is any change in what happens during your seizures.  You keep having seizures with treatment.  You have symptoms of being sick or having an infection. Get help right away if:  You have a seizure that: ? Lasts longer than 5 minutes. ? Is different than seizures you had before. ? Makes it harder to breathe. ? Happens after you hurt your head.  You have any of these symptoms after a seizure: ? Not being able to speak. ? Not being able to use a part of your body. ? Confusion. ? A bad headache.  You have two or more seizures in a row.  You do not wake up right after a seizure.  You get hurt during a seizure. These symptoms may be an emergency. Do not wait to see if the symptoms will go away. Get medical help right away. Call your local emergency services (911 in the [place] Do not drive yourself to the hospital. Summary  Seizures usually last from 30 seconds to 2 minutes. Usually, they are not harmful unless they last a long time.  Do not eat or drink anything that may keep your medicine from working, such as alcohol.  Teach friends and family what to do when you have a seizure.  Contact your doctor each time you have a seizure. This information is not intended to replace advice given to you by your health care provider. Make sure you discuss any questions you have with your health care provider. Document Revised: 09/06/2018 Document Reviewed: 09/06/2018 Elsevier Patient Education  [place].

## 2019-10-22 NOTE — Progress Notes (Signed)
PATIENT: Jacqueline [name] DOB: 11-21-1970  REASON FOR VISIT: follow up HISTORY FROM: patient  Chief Complaint  Patient presents with  . Follow-up    pt has no complaints   . Seizures     HISTORY OF PRESENT ILLNESS: Today 10/22/19 Jacqueline [name] is a 49 y.o. female here today for follow up for seizures. She continues Depakote ER 1000mg  daily and topiramate 100mg  twice daily. She reports having a seizure about 1-2 weeks prior to menses 10/14/2019. She thinks last seizure was April 1st. She usually has one event prior to start of menses. She is having irregular menses over the past year. Event usually lasts about 20-30 seconds. She will stare off to the side. She is unresponsive during event. She has some mild fatigue but denies post ictal confusion. No tonic clonic movements. No incontinence or tongue injuries. She does not drive. 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   HISTORY: (copied from Dr Dohmeier's note on 01/21/2019)  [name] is a meanwhile 49 year old african american female patient followed for infrequent seizures. She is now perimenopausal, last period was 2 weeks ago, spotting. She has a seizure in  May 8th-15 th, and was witnessed. Lasting 20 seconds, no fall to the ground, no tongue bite- just staring off, speech arrest, not convulsive. - its following an aura of dream-like changes of awareness.  Since her seizures were related to menstrual cycles in the past, she hopes this too will improve in menopause.  She quit smoking earlier this year, but relapsed, she smokes now 4 a day. No alcohol, no HRT or birth control pills.   She  has a history of complex partial seizures. She reports  seizures  during her menstrual cycle these are not every month and less frequent than previously, .  She reports , she will have a "funny sensation" at which point she will sit down. She may have staring spell she denies any loss of consciousness. She is aware of  her surroundings.  There has been no evidence of generalized seizure , tongue biting bruises, incontinence etc. She has no fatigue or headache afterwards. She is currently generic Depakote.and  Topamax  due to expense.  So far she has not noticed any difference. She denies any balance issues.    HISTORY 07/06/14 [name] is a 49 y.o. female. She carries the diagnosis of seizures and has been seizure free for 8 years, since being treated at [facility] .  She had seizure related to her menstrual cycle.  She has never seen me before, she reported, she was followed by Dr. [name] and Cecille Rubin, [facility].  Mrs. Khun is a right-handed African-American female who was last seen by Cecille Rubin on 05-21-13. She has a history of complex partial seizures that MRIs in the past has been negative. Her spells were announced by a "funny sensation", That she could not control . The spells followed quickly after, staring and feeling "interrupted. She would miss her turn in a card game and miss parts of conversations. These very brief spells were never leading to a generalized convulsion and she never lost complete awareness of her surroundings they were not followed by any headaches bite marks unexplained bruises or a sudden degree of fatigue. Depakote was added to her medication in February 2009 in October 2090 at her last seizure.  She takes Topamax at 200 mg daily and Depakote at 1000 mg daily.  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 She gained weight over the years on Depakote, over 100 pounds. She reached a plateau    REVIEW OF SYSTEMS: Out of a complete 14 system review of symptoms, the patient complains only of the following symptoms, seizures and all other reviewed systems are negative.  ALLERGIES: No Known Allergies  HOME MEDICATIONS: Outpatient Medications Prior to Visit  Medication Sig Dispense Refill  . Prenatal Vit-Fe  Fumarate-FA (PRENATAL VITAMIN PO) Take by mouth. One a day vitamin takes one tablet daily    . DEPAKOTE ER 500 MG 24 hr tablet TAKE 2 TABLETS BY MOUTH EVERY DAY 180 tablet 3  . topiramate (TOPAMAX) 100 MG tablet Take 1 tablet (100 mg total) by mouth 2 (two) times daily. 180 tablet 3   No facility-administered medications prior to visit.    PAST MEDICAL HISTORY: Past Medical History:  Diagnosis Date  . Seizure disorder (HCC)   . Seizures (HCC)     PAST SURGICAL HISTORY: Past Surgical History:  Procedure Laterality Date  . broken nose     in high school    FAMILY HISTORY: Family History  Problem Relation Age of Onset  . Diabetes Father   . Seizures Brother     SOCIAL HISTORY: Social History   Socioeconomic History  . Marital status: Married    Spouse name: [name]  . Number of children: 2  . Years of education: college  . Highest education level: Not on file  Occupational History  . Occupation: Inventory control    Comment: [employer]  Tobacco Use  . Smoking status: Former Smoker    Packs/day: 0.00    Types: E-cigarettes, Cigarettes    Quit date: 08/22/2016    Years since quitting: 3.1  . Smokeless tobacco: Never Used  . Tobacco comment: quit smoking february 2018  Substance and Sexual Activity  . Alcohol use: No  . Drug use: No  . Sexual activity: Not on file  Other Topics Concern  . Not on file  Social History Narrative   [name] and lives at home with her family.   Patient has 2 children.   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   Patient has a college education.   Patient drinks 1 cup of caffeine daily.      Social Determinants of Health   Financial Resource Strain:   . Difficulty of Paying Living Expenses:   Food Insecurity:   . Worried About Programme researcher, broadcasting/film/video in the Last Year:   . Barista in the Last Year:   Transportation Needs:   . Freight forwarder (Medical):   Marland Kitchen Lack of Transportation  (Non-Medical):   Physical Activity:   . Days of Exercise per Week:   . Minutes of Exercise per Session:   Stress:   . Feeling of Stress :   Social Connections:   . Frequency of Communication with Friends and Family:   . Frequency of Social Gatherings with Friends and Family:   . Attends Religious Services:   . Active Member of Clubs or Organizations:   . Attends Banker Meetings:   Marland Kitchen Marital Status:   Intimate Partner Violence:   . Fear of Current or Ex-Partner:   . Emotionally Abused:   Marland Kitchen Physically Abused:   . Sexually Abused:       PHYSICAL EXAM  Vitals:   10/22/19 0740  BP: 109/66  Pulse: 81  Temp: (!) 97.1 F (36.2 C)  Weight: 174 lb (78.9 kg)  Height: 5\' 5"  (1.651 m)   Body mass index is 28.96 kg/m.  Generalized: Well developed, in no acute distress  Cardiology: normal rate and rhythm, no murmur noted Respiratory: clear to auscultation bilaterally  Neurological examination  Mentation: Alert oriented to time, place, history taking. Follows all commands speech and language fluent Cranial nerve II-XII: Pupils were equal round reactive to light. Extraocular movements were full, visual field were full  Motor: The motor testing reveals 5 over 5 strength of all 4 extremities. Good symmetric motor tone is noted throughout.  Gait and station: Gait is normal.   DIAGNOSTIC DATA (LABS, IMAGING, TESTING) - I reviewed patient records, labs, notes, testing and imaging myself where available.  No flowsheet data found.   Lab Results  Component Value Date   WBC 13.5 (H) 05/22/2018   HGB 12.3 05/22/2018   HCT 39.2 05/22/2018   MCV 89 05/22/2018   PLT 362 05/22/2018      Component Value Date/Time   NA 139 05/22/2018 0813   K 4.3 05/22/2018 0813   CL 105 05/22/2018 0813   CO2 19 (L) 05/22/2018 0813   GLUCOSE 182 (H) 05/22/2018 0813   BUN 7 05/22/2018 0813   CREATININE 0.76 05/22/2018 0813   CALCIUM 9.5 05/22/2018 0813   PROT 6.9 05/22/2018 0813    ALBUMIN 4.0 05/22/2018 0813   AST 25 05/22/2018 0813   ALT 18 05/22/2018 0813   ALKPHOS 98 05/22/2018 0813   BILITOT <0.2 05/22/2018 0813   GFRNONAA 92 05/22/2018 0813   GFRAA 107 05/22/2018 0813   No results found for: CHOL, HDL, LDLCALC, LDLDIRECT, TRIG, CHOLHDL No results found for: 05/24/2018 No results found for: VITAMINB12 No results found for: TSH   ASSESSMENT AND PLAN 49 y.o. year old female  has a past medical history of Seizure disorder (HCC) and Seizures (HCC). here with     ICD-10-CM   1. Partial symptomatic epilepsy with complex partial seizures, not intractable, without status epilepticus (HCC)  G40.209 CBC with Differential/Platelets    CMP    Valproic Acid Level    Dilynn is doing well. She continues Depakote ER 1000mg  daily and topiramate 100mg  twice daily. No adverse effects. Seizure activity continues about 2 weeks prior to onset of menses. Fortunately, menstrual cycles are becoming less frequent. We will continue current therapy for now. Per Dr Levert Feinstein, may consider lamotrigine in place of Depakote if seizure frequency does not continue to decline. She is very anxious about upcoming jury duty. She is fearful that she may have a seizure while serving. I have discussed with Dr and will start lorazepam 0.5mg  2-3 days prior to jury duty on 11/03/2019 and continue for 7 days. She was educated on potential side effects. She was advised that this is a short term treatment plan and will not be continued. She will follow up in 1 year, sooner if needed. She will call with any new or worsening concerns. She verbalizes understanding and agreement with this plan.    Orders Placed This Encounter  Procedures  . CBC with Differential/Platelets  . CMP  . Valproic Acid Level     Meds ordered this encounter  Medications  . DEPAKOTE ER 500 MG 24 hr tablet    Sig: Take 2 tablets (1,000 mg total) by mouth daily.    Dispense:  180 tablet    Refill:  3  Order Specific  Question:   Supervising Provider    Answer:   Anson Fret [id number]  . topiramate (TOPAMAX) 100 MG tablet    Sig: Take 1 tablet (100 mg total) by mouth 2 (two) times daily.    Dispense:  180 tablet    Refill:  3    Order Specific Question:   Supervising Provider    Answer:   Anson Fret [id number]      I spent 20 minutes with the patient. 50% of this time was spent counseling and educating patient on plan of care and medications.    Shawnie Dapper, FNP-C 10/22/2019, 8:30 AM [facility] [address] [place], [place] [address] [phone]

## 2019-10-23 ENCOUNTER — Telehealth: Payer: Self-pay | Admitting: *Deleted

## 2019-10-23 LAB — CBC WITH DIFFERENTIAL/PLATELET
Basophils Absolute: 0.1 10*3/uL (ref 0.0–0.2)
Basos: 1 %
EOS (ABSOLUTE): 0.1 10*3/uL (ref 0.0–0.4)
Eos: 1 %
Hematocrit: 43.3 % (ref 34.0–46.6)
Hemoglobin: 13.6 g/dL (ref 11.1–15.9)
Immature Grans (Abs): 0.1 10*3/uL (ref 0.0–0.1)
Immature Granulocytes: 1 %
Lymphocytes Absolute: 5 10*3/uL — ABNORMAL HIGH (ref 0.7–3.1)
Lymphs: 46 %
MCH: 28.8 pg (ref 26.6–33.0)
MCHC: 31.4 g/dL — ABNORMAL LOW (ref 31.5–35.7)
MCV: 92 fL (ref 79–97)
Monocytes Absolute: 0.6 10*3/uL (ref 0.1–0.9)
Monocytes: 6 %
Neutrophils Absolute: 4.7 10*3/uL (ref 1.4–7.0)
Neutrophils: 45 %
Platelets: 311 10*3/uL (ref 150–450)
RBC: 4.72 x10E6/uL (ref 3.77–5.28)
RDW: 13.3 % (ref 11.7–15.4)
WBC: 10.5 10*3/uL (ref 3.4–10.8)

## 2019-10-23 LAB — COMPREHENSIVE METABOLIC PANEL
ALT: 16 IU/L (ref 0–32)
AST: 19 IU/L (ref 0–40)
Albumin/Globulin Ratio: 1.7 (ref 1.2–2.2)
Albumin: 4.3 g/dL (ref 3.8–4.9)
Alkaline Phosphatase: 113 IU/L (ref 39–117)
BUN/Creatinine Ratio: 8 — ABNORMAL LOW (ref 9–23)
BUN: 7 mg/dL (ref 6–24)
Bilirubin Total: 0.2 mg/dL (ref 0.0–1.2)
CO2: 21 mmol/L (ref 20–29)
Calcium: 9.7 mg/dL (ref 8.7–10.2)
Chloride: 102 mmol/L (ref 96–106)
Creatinine, Ser: 0.85 mg/dL (ref 0.57–1.00)
GFR calc Af Amer: 92 mL/min/{1.73_m2} (ref >59)
GFR calc non Af Amer: 80 mL/min/{1.73_m2} (ref >59)
Globulin, Total: 2.6 g/dL (ref 1.5–4.5)
Glucose: 259 mg/dL — ABNORMAL HIGH (ref 65–99)
Potassium: 4.5 mmol/L (ref 3.5–5.2)
Sodium: 137 mmol/L (ref 134–144)
Total Protein: 6.9 g/dL (ref 6.0–8.5)

## 2019-10-23 LAB — VALPROIC ACID LEVEL: Valproic Acid Lvl: 82 ug/mL (ref 50–100)

## 2019-10-23 NOTE — Telephone Encounter (Signed)
I called pt with labs results.  Relayed that stable except for glucose that is elevated at 259.  Recommended to see pcp asap as untreated/uncontrolled blood sugars can be life threatening.  She will call for appt to see pcp (new patient) as she has no pcp at this time.  She will call us back and we will fax note and lab results to pcp.  She verbalized understanding.

## 2019-10-23 NOTE — Telephone Encounter (Signed)
-----   Message from Shawnie Dapper, NP sent at 10/23/2019  7:58 AM EDT ----- Please let her know that her labs look stable with the exception of her glucose. It was very high. Has she been diagnosed with Diabetes in the past? I can not find anything in her history and she is not on any type of hyperglycemic medications. I need her to see PCP asap for additional testing. No PCP listed on chart. Does she have someone that she sees regularly? It is very important that she have this looked into asap as untreated/uncontrolled blood sugars can be life threatening. Please let me know if she has someone she can see quickly.

## 2019-10-31 ENCOUNTER — Encounter: Payer: Self-pay | Admitting: Registered Nurse

## 2019-10-31 ENCOUNTER — Ambulatory Visit: Payer: BC Managed Care – PPO | Admitting: Registered Nurse

## 2019-10-31 ENCOUNTER — Other Ambulatory Visit: Payer: Self-pay

## 2019-10-31 VITALS — BP 124/81 | HR 88 | Temp 97.8°F | Resp 17 | Ht 65.0 in | Wt 175.0 lb

## 2019-10-31 DIAGNOSIS — Z13228 Encounter for screening for other metabolic disorders: Secondary | ICD-10-CM | POA: Diagnosis not present

## 2019-10-31 DIAGNOSIS — Z8639 Personal history of other endocrine, nutritional and metabolic disease: Secondary | ICD-10-CM | POA: Diagnosis not present

## 2019-10-31 DIAGNOSIS — Z1329 Encounter for screening for other suspected endocrine disorder: Secondary | ICD-10-CM | POA: Diagnosis not present

## 2019-10-31 DIAGNOSIS — E119 Type 2 diabetes mellitus without complications: Secondary | ICD-10-CM

## 2019-10-31 DIAGNOSIS — Z130 Encounter for screening for diseases of the blood and blood-forming organs and certain disorders involving the immune mechanism: Secondary | ICD-10-CM

## 2019-10-31 DIAGNOSIS — Z1159 Encounter for screening for other viral diseases: Secondary | ICD-10-CM

## 2019-10-31 DIAGNOSIS — Z13220 Encounter for screening for lipoid disorders: Secondary | ICD-10-CM

## 2019-10-31 DIAGNOSIS — Z7689 Persons encountering health services in other specified circumstances: Secondary | ICD-10-CM

## 2019-10-31 LAB — POCT GLYCOSYLATED HEMOGLOBIN (HGB A1C): Hemoglobin A1C: 12.4 % — AB (ref 4.0–5.6)

## 2019-10-31 LAB — GLUCOSE, POCT (MANUAL RESULT ENTRY): POC Glucose: 129 mg/dl — AB (ref 70–99)

## 2019-10-31 MED ORDER — METFORMIN HCL 1000 MG PO TABS: 1000.0000 mg | ORAL_TABLET | Freq: Two times a day (BID) | ORAL | 1 refills | Status: DC

## 2019-10-31 MED ORDER — BLOOD GLUCOSE METER KIT: PACK | 0 refills | Status: AC

## 2019-10-31 NOTE — Patient Instructions (Addendum)
If you have lab work done today you will be contacted with your lab results within the next 2 weeks.  If you have not heard from [place] then please contact us. The fastest way to get your results is to register for My Chart.   IF you received an x-ray today, you will receive an invoice from [facility]. Please contact [facility] at [phone] with questions or concerns regarding your invoice.   IF you received labwork today, you will receive an invoice from [place]. Please contact [facility] at [phone] with questions or concerns regarding your invoice.   Our billing staff will not be able to assist you with questions regarding bills from these companies.  You will be contacted with the lab results as soon as they are available. The fastest way to get your results is to activate your My Chart account. Instructions are located on the last page of this paperwork. If you have not heard from [place] regarding the results in 2 weeks, please contact this office.      Diabetes Basics  Diabetes (diabetes mellitus) is a long-term (chronic) disease. It occurs when the body does not properly use sugar (glucose) that is released from food after you eat. Diabetes may be caused by one or both of these problems:  Your pancreas does not make enough of a hormone called insulin.  Your body does not react in a normal way to insulin that it makes. Insulin lets sugars (glucose) go into cells in your body. This gives you energy. If you have diabetes, sugars cannot get into cells. This causes high blood sugar (hyperglycemia). Follow these instructions at home: How is diabetes treated? You may need to take insulin or other diabetes medicines daily to keep your blood sugar in balance. Take your diabetes medicines every day as told by your doctor. List your diabetes medicines here: Diabetes medicines  Name of medicine: ______________________________ ? Amount (dose): _______________ Time  (a.m./p.m.): _______________ Notes: ___________________________________  Name of medicine: ______________________________ ? Amount (dose): _______________ Time (a.m./p.m.): _______________ Notes: ___________________________________  Name of medicine: ______________________________ ? Amount (dose): _______________ Time (a.m./p.m.): _______________ Notes: ___________________________________ If you use insulin, you will learn how to give yourself insulin by injection. You may need to adjust the amount based on the food that you eat. List the types of insulin you use here: Insulin  Insulin type: ______________________________ ? Amount (dose): _______________ Time (a.m./p.m.): _______________ Notes: ___________________________________  Insulin type: ______________________________ ? Amount (dose): _______________ Time (a.m./p.m.): _______________ Notes: ___________________________________  Insulin type: ______________________________ ? Amount (dose): _______________ Time (a.m./p.m.): _______________ Notes: ___________________________________  Insulin type: ______________________________ ? Amount (dose): _______________ Time (a.m./p.m.): _______________ Notes: ___________________________________  Insulin type: ______________________________ ? Amount (dose): _______________ Time (a.m./p.m.): _______________ Notes: ___________________________________ How do I manage my blood sugar?  Check your blood sugar levels using a blood glucose monitor as directed by your doctor. Your doctor will set treatment goals for you. Generally, you should have these blood sugar levels:  Before meals (preprandial): 80-130 mg/dL (4.4-7.2 mmol/L).  After meals (postprandial): below 180 mg/dL (10 mmol/L).  A1c level: less than 7%. Write down the times that you will check your blood sugar levels: Blood sugar checks  Time: _______________ Notes: ___________________________________  Time: _______________ Notes:  ___________________________________  Time: _______________ Notes: ___________________________________  Time: _______________ Notes: ___________________________________  Time: _______________ Notes: ___________________________________  Time: _______________ Notes: ___________________________________  What do I need to know about low blood sugar? Low blood sugar is called hypoglycemia. This is when blood sugar  is at or below 70 mg/dL (3.9 mmol/L). Symptoms may include:  Feeling: ? Hungry. ? Worried or nervous (anxious). ? Sweaty and clammy. ? Confused. ? Dizzy. ? Sleepy. ? Sick to your stomach (nauseous).  Having: ? A fast heartbeat. ? A headache. ? A change in your vision. ? Tingling or no feeling (numbness) around the mouth, lips, or tongue. ? Jerky movements that you cannot control (seizure).  Having trouble with: ? Moving (coordination). ? Sleeping. ? Passing out (fainting). ? Getting upset easily (irritability). Treating low blood sugar To treat low blood sugar, eat or drink something sugary right away. If you can think clearly and swallow safely, follow the 15:15 rule:  Take 15 grams of a fast-acting carb (carbohydrate). Talk with your doctor about how much you should take.  Some fast-acting carbs are: ? Sugar tablets (glucose pills). Take 3-4 glucose pills. ? 6-8 pieces of hard candy. ? 4-6 oz (120-150 mL) of fruit juice. ? 4-6 oz (120-150 mL) of regular (not diet) soda. ? 1 Tbsp (15 mL) honey or sugar.  Check your blood sugar 15 minutes after you take the carb.  If your blood sugar is still at or below 70 mg/dL (3.9 mmol/L), take 15 grams of a carb again.  If your blood sugar does not go above 70 mg/dL (3.9 mmol/L) after 3 tries, get help right away.  After your blood sugar goes back to normal, eat a meal or a snack within 1 hour. Treating very low blood sugar If your blood sugar is at or below 54 mg/dL (3 mmol/L), you have very low blood sugar (severe  hypoglycemia). This is an emergency. Do not wait to see if the symptoms will go away. Get medical help right away. Call your local emergency services (911 in the [place] Do not drive yourself to the hospital. Questions to ask your health care provider  Do I need to meet with a diabetes educator?  What equipment will I need to care for myself at home?  What diabetes medicines do I need? When should I take them?  How often do I need to check my blood sugar?  What number can I call if I have questions?  When is my next doctor's visit?  Where can I find a support group for people with diabetes? Where to find more information  American Diabetes Association: www.diabetes.org  American Association of Diabetes Educators: www.diabeteseducator.org/patient-resources Contact a doctor if:  Your blood sugar is at or above 240 mg/dL (13.3 mmol/L) for 2 days in a row.  You have been sick or have had a fever for 2 days or more, and you are not getting better.  You have any of these problems for more than 6 hours: ? You cannot eat or drink. ? You feel sick to your stomach (nauseous). ? You throw up (vomit). ? You have watery poop (diarrhea). Get help right away if:  Your blood sugar is lower than 54 mg/dL (3 mmol/L).  You get confused.  You have trouble: ? Thinking clearly. ? Breathing. Summary  Diabetes (diabetes mellitus) is a long-term (chronic) disease. It occurs when the body does not properly use sugar (glucose) that is released from food after digestion.  Take insulin and diabetes medicines as told.  Check your blood sugar every day, as often as told.  Keep all follow-up visits as told by your doctor. This is important. This information is not intended to replace advice given to you by your health care provider. Make sure  you discuss any questions you have with your health care provider. Document Revised: 03/12/2019 Document Reviewed: 09/21/2017 Elsevier Patient Education   [place].  Blood Glucose Monitoring, Adult Monitoring your blood sugar (glucose) is an important part of managing your diabetes (diabetes mellitus). Blood glucose monitoring involves checking your blood glucose as often as directed and keeping a record (log) of your results over time. Checking your blood glucose regularly and keeping a blood glucose log can:  Help you and your health care provider adjust your diabetes management plan as needed, including your medicines or insulin.  Help you understand how food, exercise, illnesses, and medicines affect your blood glucose.  Let you know what your blood glucose is at any time. You can quickly find out if you have low blood glucose (hypoglycemia) or high blood glucose (hyperglycemia). Your health care provider will set individualized treatment goals for you. Your goals will be based on your age, other medical conditions you have, and how you respond to diabetes treatment. Generally, the goal of treatment is to maintain the following blood glucose levels:  Before meals (preprandial): 80-130 mg/dL (4.4-7.2 mmol/L).  After meals (postprandial): below 180 mg/dL (10 mmol/L).  A1c level: less than 7%. Supplies needed:  Blood glucose meter.  Test strips for your meter. Each meter has its own strips. You must use the strips that came with your meter.  A needle to prick your finger (lancet). Do not use a lancet more than one time.  A device that holds the lancet (lancing device).  A journal or log book to write down your results. How to check your blood glucose  1. Wash your hands with soap and water. 2. Prick the side of your finger (not the tip) with the lancet. Use a different finger each time. 3. Gently rub the finger until a small drop of blood appears. 4. Follow instructions that come with your meter for inserting the test strip, applying blood to the strip, and using your blood glucose meter. 5. Write down your result and any  notes. Some meters allow you to use areas of your body other than your finger (alternative sites) to test your blood. The most common alternative sites are:  Forearm.  Thigh.  Palm of the hand. If you think you may have hypoglycemia, or if you have a history of not knowing when your blood glucose is getting low (hypoglycemia unawareness), do not use alternative sites. Use your finger instead. Alternative sites may not be as accurate as the fingers, because blood flow is slower in these areas. This means that the result you get may be delayed, and it may be different from the result that you would get from your finger. Follow these instructions at home: Blood glucose log   Every time you check your blood glucose, write down your result. Also write down any notes about things that may be affecting your blood glucose, such as your diet and exercise for the day. This information can help you and your health care provider: ? Look for patterns in your blood glucose over time. ? Adjust your diabetes management plan as needed.  Check if your meter allows you to download your records to a computer. Most glucose meters store a record of glucose readings in the meter. If you have type 1 diabetes:  Check your blood glucose 2 or more times a day.  Also check your blood glucose: ? Before every insulin injection. ? Before and after exercise. ? Before meals. ?  2 hours after a meal. ? Occasionally between 2:00 a.m. and 3:00 a.m., as directed. ? Before potentially dangerous tasks, like driving or using heavy machinery. ? At bedtime.  You may need to check your blood glucose more often, up to 6-10 times a day, if you: ? Use an insulin pump. ? Need multiple daily injections (MDI). ? Have diabetes that is not well-controlled. ? Are ill. ? Have a history of severe hypoglycemia. ? Have hypoglycemia unawareness. If you have type 2 diabetes:  If you take insulin or other diabetes medicines, check your  blood glucose 2 or more times a day.  If you are on intensive insulin therapy, check your blood glucose 4 or more times a day. Occasionally, you may also need to check between 2:00 a.m. and 3:00 a.m., as directed.  Also check your blood glucose: ? Before and after exercise. ? Before potentially dangerous tasks, like driving or using heavy machinery.  You may need to check your blood glucose more often if: ? Your medicine is being adjusted. ? Your diabetes is not well-controlled. ? You are ill. General tips  Always keep your supplies with you.  If you have questions or need help, all blood glucose meters have a 24-hour "hotline" phone number that you can call. You may also contact your health care provider.  After you use a few boxes of test strips, adjust (calibrate) your blood glucose meter by following instructions that came with your meter. Contact a health care provider if:  Your blood glucose is at or above 240 mg/dL (13.3 mmol/L) for 2 days in a row.  You have been sick or have had a fever for 2 days or longer, and you are not getting better.  You have any of the following problems for more than 6 hours: ? You cannot eat or drink. ? You have nausea or vomiting. ? You have diarrhea. Get help right away if:  Your blood glucose is lower than 54 mg/dL (3 mmol/L).  You become confused or you have trouble thinking clearly.  You have difficulty breathing.  You have moderate or large ketone levels in your urine. Summary  Monitoring your blood sugar (glucose) is an important part of managing your diabetes (diabetes mellitus).  Blood glucose monitoring involves checking your blood glucose as often as directed and keeping a record (log) of your results over time.  Your health care provider will set individualized treatment goals for you. Your goals will be based on your age, other medical conditions you have, and how you respond to diabetes treatment.  Every time you check  your blood glucose, write down your result. Also write down any notes about things that may be affecting your blood glucose, such as your diet and exercise for the day. This information is not intended to replace advice given to you by your health care provider. Make sure you discuss any questions you have with your health care provider. Document Revised: 04/12/2018 Document Reviewed: 11/29/2015 Elsevier Patient Education  2020 [place] for Diabetes Mellitus, Adult  Carbohydrate counting is a method of keeping track of how many carbohydrates you eat. Eating carbohydrates naturally increases the amount of sugar (glucose) in the blood. Counting how many carbohydrates you eat helps keep your blood glucose within normal limits, which helps you manage your diabetes (diabetes mellitus). It is important to know how many carbohydrates you can safely have in each meal. This is different for every person. A diet and nutrition specialist (  registered dietitian) can help you make a meal plan and calculate how many carbohydrates you should have at each meal and snack. Carbohydrates are found in the following foods:  Grains, such as breads and cereals.  Dried beans and soy products.  Starchy vegetables, such as potatoes, peas, and corn.  Fruit and fruit juices.  Milk and yogurt.  Sweets and snack foods, such as cake, cookies, candy, chips, and soft drinks. How do I count carbohydrates? There are two ways to count carbohydrates in food. You can use either of the methods or a combination of both. Reading "Nutrition Facts" on packaged food The "Nutrition Facts" list is included on the labels of almost all packaged foods and beverages in the [place] It includes:  The serving size.  Information about nutrients in each serving, including the grams (g) of carbohydrate per serving. To use the "Nutrition Facts":  Decide how many servings you will have.  Multiply the number of  servings by the number of carbohydrates per serving.  The resulting number is the total amount of carbohydrates that you will be having. Learning standard serving sizes of other foods When you eat carbohydrate foods that are not packaged or do not include "Nutrition Facts" on the label, you need to measure the servings in order to count the amount of carbohydrates:  Measure the foods that you will eat with a food scale or measuring cup, if needed.  Decide how many standard-size servings you will eat.  Multiply the number of servings by 15. Most carbohydrate-rich foods have about 15 g of carbohydrates per serving. ? For example, if you eat 8 oz (170 g) of strawberries, you will have eaten 2 servings and 30 g of carbohydrates (2 servings x 15 g = 30 g).  For foods that have more than one food mixed, such as soups and casseroles, you must count the carbohydrates in each food that is included. The following list contains standard serving sizes of common carbohydrate-rich foods. Each of these servings has about 15 g of carbohydrates:   hamburger bun or  English muffin.   oz (15 mL) syrup.   oz (14 g) jelly.  1 slice of bread.  1 six-inch tortilla.  3 oz (85 g) cooked rice or pasta.  4 oz (113 g) cooked dried beans.  4 oz (113 g) starchy vegetable, such as peas, corn, or potatoes.  4 oz (113 g) hot cereal.  4 oz (113 g) mashed potatoes or  of a large baked potato.  4 oz (113 g) canned or frozen fruit.  4 oz (120 mL) fruit juice.  4-6 crackers.  6 chicken nuggets.  6 oz (170 g) unsweetened dry cereal.  6 oz (170 g) plain fat-free yogurt or yogurt sweetened with artificial sweeteners.  8 oz (240 mL) milk.  8 oz (170 g) fresh fruit or one small piece of fruit.  24 oz (680 g) popped popcorn. Example of carbohydrate counting Sample meal  3 oz (85 g) chicken breast.  6 oz (170 g) brown rice.  4 oz (113 g) corn.  8 oz (240 mL) milk.  8 oz (170 g) strawberries  with sugar-free whipped topping. Carbohydrate calculation 1. Identify the foods that contain carbohydrates: ? Rice. ? Corn. ? Milk. ? Strawberries. 2. Calculate how many servings you have of each food: ? 2 servings rice. ? 1 serving corn. ? 1 serving milk. ? 1 serving strawberries. 3. Multiply each number of servings by 15 g: ? 2 servings rice  x 15 g = 30 g. ? 1 serving corn x 15 g = 15 g. ? 1 serving milk x 15 g = 15 g. ? 1 serving strawberries x 15 g = 15 g. 4. Add together all of the amounts to find the total grams of carbohydrates eaten: ? 30 g + 15 g + 15 g + 15 g = 75 g of carbohydrates total. Summary  Carbohydrate counting is a method of keeping track of how many carbohydrates you eat.  Eating carbohydrates naturally increases the amount of sugar (glucose) in the blood.  Counting how many carbohydrates you eat helps keep your blood glucose within normal limits, which helps you manage your diabetes.  A diet and nutrition specialist (registered dietitian) can help you make a meal plan and calculate how many carbohydrates you should have at each meal and snack. This information is not intended to replace advice given to you by your health care provider. Make sure you discuss any questions you have with your health care provider. Document Revised: 01/11/2017 Document Reviewed: 12/01/2015 Elsevier Patient Education  [place].  Diabetes Mellitus and Exercise Exercising regularly is important for your overall health, especially when you have diabetes (diabetes mellitus). Exercising is not only about losing weight. It has many other health benefits, such as increasing muscle strength and bone density and reducing body fat and stress. This leads to improved fitness, flexibility, and endurance, all of which result in better overall health. Exercise has additional benefits for people with diabetes, including:  Reducing appetite.  Helping to lower and control blood  glucose.  Lowering blood pressure.  Helping to control amounts of fatty substances (lipids) in the blood, such as cholesterol and triglycerides.  Helping the body to respond better to insulin (improving insulin sensitivity).  Reducing how much insulin the body needs.  Decreasing the risk for heart disease by: ? Lowering cholesterol and triglyceride levels. ? Increasing the levels of good cholesterol. ? Lowering blood glucose levels. What is my activity plan? Your health care provider or certified diabetes educator can help you make a plan for the type and frequency of exercise (activity plan) that works for you. Make sure that you:  Do at least 150 minutes of moderate-intensity or vigorous-intensity exercise each week. This could be brisk walking, biking, or water aerobics. ? Do stretching and strength exercises, such as yoga or weightlifting, at least 2 times a week. ? Spread out your activity over at least 3 days of the week.  Get some form of physical activity every day. ? Do not go more than 2 days in a row without some kind of physical activity. ? Avoid being inactive for more than 30 minutes at a time. Take frequent breaks to walk or stretch.  Choose a type of exercise or activity that you enjoy, and set realistic goals.  Start slowly, and gradually increase the intensity of your exercise over time. What do I need to know about managing my diabetes?   Check your blood glucose before and after exercising. ? If your blood glucose is 240 mg/dL (13.3 mmol/L) or higher before you exercise, check your urine for ketones. If you have ketones in your urine, do not exercise until your blood glucose returns to normal. ? If your blood glucose is 100 mg/dL (5.6 mmol/L) or lower, eat a snack containing 15-20 grams of carbohydrate. Check your blood glucose 15 minutes after the snack to make sure that your level is above 100 mg/dL (5.6 mmol/L) before  you start your exercise.  Know the  symptoms of low blood glucose (hypoglycemia) and how to treat it. Your risk for hypoglycemia increases during and after exercise. Common symptoms of hypoglycemia can include: ? Hunger. ? Anxiety. ? Sweating and feeling clammy. ? Confusion. ? Dizziness or feeling light-headed. ? Increased heart rate or palpitations. ? Blurry vision. ? Tingling or numbness around the mouth, lips, or tongue. ? Tremors or shakes. ? Irritability.  Keep a rapid-acting carbohydrate snack available before, during, and after exercise to help prevent or treat hypoglycemia.  Avoid injecting insulin into areas of the body that are going to be exercised. For example, avoid injecting insulin into: ? The arms, when playing tennis. ? The legs, when jogging.  Keep records of your exercise habits. Doing this can help you and your health care provider adjust your diabetes management plan as needed. Write down: ? Food that you eat before and after you exercise. ? Blood glucose levels before and after you exercise. ? The type and amount of exercise you have done. ? When your insulin is expected to peak, if you use insulin. Avoid exercising at times when your insulin is peaking.  When you start a new exercise or activity, work with your health care provider to make sure the activity is safe for you, and to adjust your insulin, medicines, or food intake as needed.  Drink plenty of water while you exercise to prevent dehydration or heat stroke. Drink enough fluid to keep your urine clear or pale yellow. Summary  Exercising regularly is important for your overall health, especially when you have diabetes (diabetes mellitus).  Exercising has many health benefits, such as increasing muscle strength and bone density and reducing body fat and stress.  Your health care provider or certified diabetes educator can help you make a plan for the type and frequency of exercise (activity plan) that works for you.  When you start a new  exercise or activity, work with your health care provider to make sure the activity is safe for you, and to adjust your insulin, medicines, or food intake as needed. This information is not intended to replace advice given to you by your health care provider. Make sure you discuss any questions you have with your health care provider. Document Revised: 01/11/2017 Document Reviewed: 11/29/2015 Elsevier Patient Education  [place].  Diabetes Mellitus and [place] care is an important part of your health, especially when you have diabetes. Diabetes may cause you to have problems because of poor blood flow (circulation) to your feet and legs, which can cause your skin to:  Become thinner and drier.  Break more easily.  Heal more slowly.  Peel and crack. You may also have nerve damage (neuropathy) in your legs and feet, causing decreased feeling in them. This means that you may not notice minor injuries to your feet that could lead to more serious problems. Noticing and addressing any potential problems early is the best way to prevent future foot problems. How to care for your feet Foot hygiene  Wash your feet daily with warm water and mild soap. Do not use hot water. Then, pat your feet and the areas between your toes until they are completely dry. Do not soak your feet as this can dry your skin.  Trim your toenails straight across. Do not dig under them or around the cuticle. File the edges of your nails with an emery board or nail file.  Apply a moisturizing lotion  or petroleum jelly to the skin on your feet and to dry, brittle toenails. Use lotion that does not contain alcohol and is unscented. Do not apply lotion between your toes. Shoes and socks  Wear clean socks or stockings every day. Make sure they are not too tight. Do not wear knee-high stockings since they may decrease blood flow to your legs.  Wear shoes that fit properly and have enough cushioning. Always look  in your shoes before you put them on to be sure there are no objects inside.  To break in new shoes, wear them for just a few hours a day. This prevents injuries on your feet. Wounds, scrapes, corns, and calluses  Check your feet daily for blisters, cuts, bruises, sores, and redness. If you cannot see the bottom of your feet, use a mirror or ask someone for help.  Do not cut corns or calluses or try to remove them with medicine.  If you find a minor scrape, cut, or break in the skin on your feet, keep it and the skin around it clean and dry. You may clean these areas with mild soap and water. Do not clean the area with peroxide, alcohol, or iodine.  If you have a wound, scrape, corn, or callus on your foot, look at it several times a day to make sure it is healing and not infected. Check for: ? Redness, swelling, or pain. ? Fluid or blood. ? Warmth. ? Pus or a bad smell. General instructions  Do not cross your legs. This may decrease blood flow to your feet.  Do not use heating pads or hot water bottles on your feet. They may burn your skin. If you have lost feeling in your feet or legs, you may not know this is happening until it is too late.  Protect your feet from hot and cold by wearing shoes, such as at the beach or on hot pavement.  Schedule a complete foot exam at least once a year (annually) or more often if you have foot problems. If you have foot problems, report any cuts, sores, or bruises to your health care provider immediately. Contact a health care provider if:  You have a medical condition that increases your risk of infection and you have any cuts, sores, or bruises on your feet.  You have an injury that is not healing.  You have redness on your legs or feet.  You feel burning or tingling in your legs or feet.  You have pain or cramps in your legs and feet.  Your legs or feet are numb.  Your feet always feel cold.  You have pain around a toenail. Get help  right away if:  You have a wound, scrape, corn, or callus on your foot and: ? You have pain, swelling, or redness that gets worse. ? You have fluid or blood coming from the wound, scrape, corn, or callus. ? Your wound, scrape, corn, or callus feels warm to the touch. ? You have pus or a bad smell coming from the wound, scrape, corn, or callus. ? You have a fever. ? You have a red line going up your leg. Summary  Check your feet every day for cuts, sores, red spots, swelling, and blisters.  Moisturize feet and legs daily.  Wear shoes that fit properly and have enough cushioning.  If you have foot problems, report any cuts, sores, or bruises to your health care provider immediately.  Schedule a complete foot exam at least  once a year (annually) or more often if you have foot problems. This information is not intended to replace advice given to you by your health care provider. Make sure you discuss any questions you have with your health care provider. Document Revised: 03/12/2019 Document Reviewed: 07/21/2016 Elsevier Patient Education  [place].  Diabetes Mellitus and Nutrition, Adult When you have diabetes (diabetes mellitus), it is very important to have healthy eating habits because your blood sugar (glucose) levels are greatly affected by what you eat and drink. Eating healthy foods in the appropriate amounts, at about the same times every day, can help you:  Control your blood glucose.  Lower your risk of heart disease.  Improve your blood pressure.  Reach or maintain a healthy weight. Every person with diabetes is different, and each person has different needs for a meal plan. Your health care provider may recommend that you work with a diet and nutrition specialist (dietitian) to make a meal plan that is best for you. Your meal plan may vary depending on factors such as:  The calories you need.  The medicines you take.  Your weight.  Your blood glucose,  blood pressure, and cholesterol levels.  Your activity level.  Other health conditions you have, such as heart or kidney disease. How do carbohydrates affect me? Carbohydrates, also called carbs, affect your blood glucose level more than any other type of food. Eating carbs naturally raises the amount of glucose in your blood. Carb counting is a method for keeping track of how many carbs you eat. Counting carbs is important to keep your blood glucose at a healthy level, especially if you use insulin or take certain oral diabetes medicines. It is important to know how many carbs you can safely have in each meal. This is different for every person. Your dietitian can help you calculate how many carbs you should have at each meal and for each snack. Foods that contain carbs include:  Bread, cereal, rice, pasta, and crackers.  Potatoes and corn.  Peas, beans, and lentils.  Milk and yogurt.  Fruit and juice.  Desserts, such as cakes, cookies, ice cream, and candy. How does alcohol affect me? Alcohol can cause a sudden decrease in blood glucose (hypoglycemia), especially if you use insulin or take certain oral diabetes medicines. Hypoglycemia can be a life-threatening condition. Symptoms of hypoglycemia (sleepiness, dizziness, and confusion) are similar to symptoms of having too much alcohol. If your health care provider says that alcohol is safe for you, follow these guidelines:  Limit alcohol intake to no more than 1 drink per day for nonpregnant women and 2 drinks per day for men. One drink equals 12 oz of beer, 5 oz of wine, or 1 oz of hard liquor.  Do not drink on an empty stomach.  Keep yourself hydrated with water, diet soda, or unsweetened iced tea.  Keep in mind that regular soda, juice, and other mixers may contain a lot of sugar and must be counted as carbs. What are tips for following this plan?  Reading food labels  Start by checking the serving size on the "Nutrition  Facts" label of packaged foods and drinks. The amount of calories, carbs, fats, and other nutrients listed on the label is based on one serving of the item. Many items contain more than one serving per package.  Check the total grams (g) of carbs in one serving. You can calculate the number of servings of carbs in one serving by dividing  the total carbs by 15. For example, if a food has 30 g of total carbs, it would be equal to 2 servings of carbs.  Check the number of grams (g) of saturated and trans fats in one serving. Choose foods that have low or no amount of these fats.  Check the number of milligrams (mg) of salt (sodium) in one serving. Most people should limit total sodium intake to less than 2,300 mg per day.  Always check the nutrition information of foods labeled as "low-fat" or "nonfat". These foods may be higher in added sugar or refined carbs and should be avoided.  Talk to your dietitian to identify your daily goals for nutrients listed on the label. Shopping  Avoid buying canned, premade, or processed foods. These foods tend to be high in fat, sodium, and added sugar.  Shop around the outside edge of the grocery store. This includes fresh fruits and vegetables, bulk grains, fresh meats, and fresh dairy. Cooking  Use low-heat cooking methods, such as baking, instead of high-heat cooking methods like deep frying.  Cook using healthy oils, such as olive, canola, or sunflower oil.  Avoid cooking with butter, cream, or high-fat meats. Meal planning  Eat meals and snacks regularly, preferably at the same times every day. Avoid going long periods of time without eating.  Eat foods high in fiber, such as fresh fruits, vegetables, beans, and whole grains. Talk to your dietitian about how many servings of carbs you can eat at each meal.  Eat 4-6 ounces (oz) of lean protein each day, such as lean meat, chicken, fish, eggs, or tofu. One oz of lean protein is equal to: ? 1 oz of  meat, chicken, or fish. ? 1 egg. ?  cup of tofu.  Eat some foods each day that contain healthy fats, such as avocado, nuts, seeds, and fish. Lifestyle  Check your blood glucose regularly.  Exercise regularly as told by your health care provider. This may include: ? 150 minutes of moderate-intensity or vigorous-intensity exercise each week. This could be brisk walking, biking, or water aerobics. ? Stretching and doing strength exercises, such as yoga or weightlifting, at least 2 times a week.  Take medicines as told by your health care provider.  Do not use any products that contain nicotine or tobacco, such as cigarettes and e-cigarettes. If you need help quitting, ask your health care provider.  Work with a Social worker or diabetes educator to identify strategies to manage stress and any emotional and social challenges. Questions to ask a health care provider  Do I need to meet with a diabetes educator?  Do I need to meet with a dietitian?  What number can I call if I have questions?  When are the best times to check my blood glucose? Where to find more information:  American Diabetes Association: diabetes.org  Academy of Nutrition and Dietetics: www.eatright.[employer] of Diabetes and Digestive and Kidney Diseases (NIH): [employer] Summary  A healthy meal plan will help you control your blood glucose and maintain a healthy lifestyle.  Working with a diet and nutrition specialist (dietitian) can help you make a meal plan that is best for you.  Keep in mind that carbohydrates (carbs) and alcohol have immediate effects on your blood glucose levels. It is important to count carbs and to use alcohol carefully. This information is not intended to replace advice given to you by your health care provider. Make sure you discuss any questions you have with your  health care provider. Document Revised: 06/01/2017 Document Reviewed: 07/24/2016 Elsevier Patient  Education  [place].  Diabetic Nephropathy  Diabetic nephropathy is kidney disease that is caused by diabetes (diabetes mellitus). Kidneys are organs that filter and clean blood and get rid of body waste products and extra fluid. Diabetes can cause gradual kidney damage over many years. Diabetic nephropathy that continues to get worse can lead to kidney failure. What are the causes? This condition is caused by kidney damage from diabetes that is not well controlled with treatment. Having high blood sugar (glucose) for a long time because of diabetes can damage blood vessels in the kidneys and cause them to thicken and become scarred. Those changes prevent the kidneys from functioning normally. What increases the risk? This condition is more likely to develop in people with diabetes who:  Have had diabetes for many years.  Have high blood pressure.  Have high blood glucose levels over a long period of time.  Have a family history of kidney disease.  Have a history of tobacco use.  Have certain genes that are passed from parent to child (inherited). What are the signs or symptoms? This condition may not cause symptoms at first. If you do have symptoms, they may include:  Swelling of your hands, feet, or ankles.  Weakness.  Poor appetite.  Nausea.  Confusion.  Tiredness (fatigue).  Trouble sleeping.  Dry, itchy skin. If nephropathy leads to kidney failure, symptoms may include:  Vomiting.  Shortness of breath.  Jerky movements that you cannot control (seizure).  Coma. How is this diagnosed? It is important to diagnose this condition before symptoms develop. You may be screened for diabetic nephropathy at a routine health care visit. Screening tests may include:  Urine tests. These may be done every year.  Urine collection over a 24-hour period to measure kidney function.  Blood tests to measure blood glucose levels and kidney function.  Regular blood  pressure monitoring. If your health care provider suspects diabetic nephropathy, he or she may:  Review your medical history and symptoms.  Do a physical exam.  Do an ultrasound of your kidneys.  Perform a procedure to take a sample of kidney tissue for testing (biopsy). How is this treated? The goal of treatment is to prevent or slow down any damage to your kidneys by managing your diabetes. To do this, it is important to control:  Your blood pressure. ? Your target blood pressure may vary depending on your medical conditions, your age, and other factors. ? To help control blood pressure, you may be prescribed medicines to lower your blood pressure (ACE inhibitors) or to help your body get rid of excess fluid (diuretics).  Your A1c (hemoglobin A1c) level. Generally, the goal of treatment is to maintain an A1c level of less than 7%.  Your blood glucose level.  Your blood lipids. If you have high cholesterol, you may need to take lipid-lowering drugs, such as statins. Other treatments may include:  Medicines, including insulin injections.  Lifestyle changes, such as losing weight, quitting smoking, or making changes to your diet. If your disease progresses to end-stage kidney failure, treatment may include:  Dialysis. This is a procedure to filter your blood with a machine.  Kidney transplant. Follow these instructions at home: Eating and drinking  Eat healthy foods, and eat healthy snacks between meals. Follow instructions from your health care provider about eating and drinking restrictions.  Limit your sodium (salt), protein, or fluid intake as directed.  If you drink alcohol: ? Limit how much you use to:  0-1 drink a day for nonpregnant women.  0-2 drinks a day for men. ? Be aware of how much alcohol is in your drink. In the [place] one drink equals one 12 oz bottle of beer (355 mL), one 5 oz glass of wine (148 mL), or one 1 oz glass of hard liquor (44  mL). Lifestyle  Maintain a healthy weight. Work with your health care provider to lose weight, if needed.  Do not use any products that contain nicotine or tobacco, such as cigarettes, e-cigarettes, and chewing tobacco. If you need help quitting, ask your health care provider.  Be physically active every day. Ask your health care provider what type of exercise is best for you.  Work with your health care provider to manage your blood pressure. General instructions      Follow your diabetes management plan as directed. ? Check your blood glucose levels as directed by your health care provider. ? Keep your blood glucose in your target range as directed by your health care provider. ? Have your A1c level checked two or more times a year, or as often as told by your health care provider.  Measure your blood pressure regularly at home, as told by your health care provider.  Take over-the-counter and prescription medicines only as told by your health care provider. These include insulin and supplements.  Keep all follow-up visits and routine visits as told by your health care provider. This is important. Make sure you get screening tests as directed. Where to find more information American Diabetes Association: www.diabetes.org Contact a health care provider if:  You have trouble keeping your blood glucose in your goal range.  Your blood glucose level is higher than 240 mg/dL (13.3 mmol/L) for 2 days in a row.  You have swelling in your hands, ankles, or feet.  You feel weak, tired, or dizzy.  You have sudden muscle tightening (spasms).  You have nausea or vomiting.  You feel tired all the time. Get help right away if:  You are very sleepy.  You faint.  You have: ? A seizure. ? Severe, painful muscle spasms. ? Shortness of breath. ? Chest pain. Summary  Diabetic nephropathy is kidney disease that is caused by diabetes (diabetes mellitus).  Keep your blood sugar  (glucose) in your target range as directed by your health care provider.  Work with your health care provider to manage your blood pressure.  Keep all follow-up visits and routine visits as told by your health care provider. This is important. Make sure you get screening tests as directed. This information is not intended to replace advice given to you by your health care provider. Make sure you discuss any questions you have with your health care provider. Document Revised: 12/02/2018 Document Reviewed: 12/02/2018 Elsevier Patient Education  [place].  Diabetic Neuropathy Diabetic neuropathy refers to nerve damage that is caused by diabetes (diabetes mellitus). Over time, people with diabetes can develop nerve damage throughout the body. There are several types of diabetic neuropathy:  Peripheral neuropathy. This is the most common type of diabetic neuropathy. It causes damage to nerves that carry signals between the spinal cord and other parts of the body (peripheral nerves). This usually affects nerves in the feet and legs first, and may eventually affect the hands and arms. The damage affects the ability to sense touch or temperature.  Autonomic neuropathy. This type causes damage to nerves  that control involuntary functions (autonomic nerves). These nerves carry signals that control: ? Heartbeat. ? Body temperature. ? Blood pressure. ? Urination. ? Digestion. ? Sweating. ? Sexual function. ? Response to changing blood sugar (glucose) levels.  Focal neuropathy. This type of nerve damage affects one area of the body, such as an arm, a leg, or the face. The injury may involve one nerve or a small group of nerves. Focal neuropathy can be painful and unpredictable, and occurs most often in older adults with diabetes. This often develops suddenly, but usually improves over time and does not cause long-term problems.  Proximal neuropathy. This type of nerve damage affects the nerves  of the thighs, hips, buttocks, or legs. It causes severe pain, weakness, and muscle death (atrophy), usually in the thigh muscles. It is more common among older men and people who have type 2 diabetes. The length of recovery time may vary. What are the causes? Peripheral, autonomic, and focal neuropathies are caused by diabetes that is not well controlled with treatment. The cause of proximal neuropathy is not known, but it may be caused by inflammation related to uncontrolled blood glucose levels. What are the signs or symptoms? Peripheral neuropathy Peripheral neuropathy develops slowly over time. When the nerves of the feet and legs no longer work, you may experience:  Burning, stabbing, or aching pain in the legs or feet.  Pain or cramping in the legs or feet.  Loss of feeling (numbness) and inability to feel pressure or pain in the feet. This can lead to: ? Thick calluses or sores on areas of constant pressure. ? Ulcers. ? Reduced ability to feel temperature changes.  Foot deformities.  Muscle weakness.  Loss of balance or coordination. Autonomic neuropathy The symptoms of autonomic neuropathy vary depending on which nerves are affected. Symptoms may include:  Problems with digestion, such as: ? Nausea or vomiting. ? Poor appetite. ? Bloating. ? Diarrhea or constipation. ? Trouble swallowing. ? Losing weight without trying to.  Problems with the heart, blood and lungs, such as: ? Dizziness, especially when standing up. ? Fainting. ? Shortness of breath. ? Irregular heartbeat.  Bladder problems, such as: ? Trouble starting or stopping urination. ? Leaking urine. ? Trouble emptying the bladder. ? Urinary tract infections (UTIs).  Problems with other body functions, such as: ? Sweat. You may sweat too much or too little. ? Temperature. You might get hot easily. Or, you might feel cold more than usual. ? Sexual function. Men may not be able to get or maintain an  erection. Women may have vaginal dryness and difficulty with arousal. Focal neuropathy Symptoms affect only one area of the body. Common symptoms include:  Numbness.  Tingling.  Burning pain.  Prickling feeling.  Very sensitive skin.  Weakness.  Inability to move (paralysis).  Muscle twitching.  Muscles getting smaller (wasting).  Poor coordination.  Double or blurred vision. Proximal neuropathy  Sudden, severe pain in the hip, thigh, or buttocks. Pain may spread from the back into the legs (sciatica).  Pain and numbness in the arms and legs.  Tingling.  Loss of bladder control or bowel control.  Weakness and wasting of thigh muscles.  Difficulty getting up from a seated position.  Abdominal swelling.  Unexplained weight loss. How is this diagnosed? Diagnosis usually involves reviewing your medical history and any symptoms you have. Diagnosis varies depending on the type of neuropathy your health care provider suspects. Peripheral neuropathy Your health care provider will check areas that are  affected by your nervous system (neurologic exam), such as your reflexes, how you move, and what you can feel. You may have other tests, such as:  Blood tests.  Removal and examination of fluid that surrounds the spinal cord (lumbar puncture).  CT scan.  MRI.  A test to check the nerves that control muscles (electromyogram, EMG).  Tests of how quickly messages pass through your nerves (nerve conduction velocity tests).  Removal of a small piece of nerve to be examined under a microscope (biopsy). Autonomic neuropathy You may have tests, such as:  Tests to measure your blood pressure and heart rate. This may include monitoring you while you are safely secured to an exam table that moves you from a lying position to an upright position (table tilt test).  Breathing tests to check your lungs.  Tests to check how food moves through the digestive system (gastric  emptying tests).  Blood, sweat, or urine tests.  Ultrasound of your bladder.  Spinal fluid tests. Focal neuropathy This condition may be diagnosed with:  A neurologic exam.  CT scan.  MRI.  EMG.  Nerve conduction velocity tests. Proximal neuropathy There is no test to diagnose this type of neuropathy. You may have tests to rule out other possible causes of this type of neuropathy. Tests may include:  X-rays of your spine and lumbar region.  Lumbar puncture.  MRI. How is this treated? The goal of treatment is to keep nerve damage from getting worse. The most important part of treatment is keeping your blood glucose level and your A1C level within your target range by following your diabetes management plan. Over time, maintaining lower blood glucose levels helps lessen symptoms. In some cases, you may need prescription pain medicine. Follow these instructions at home:  Lifestyle   Do not use any products that contain nicotine or tobacco, such as cigarettes and e-cigarettes. If you need help quitting, ask your health care provider.  Be physically active every day. Include strength training and balance exercises.  Follow a healthy meal plan.  Work with your health care provider to manage your blood pressure. General instructions  Follow your diabetes management plan as directed. ? Check your blood glucose levels as directed by your health care provider. ? Keep your blood glucose in your target range as directed by your health care provider. ? Have your A1C level checked at least two times a year, or as often as told by your health care provider.  Take over the counter and prescription medicines only as told by your health care provider. This includes insulin and diabetes medicine.  Do not drive or use heavy machinery while taking prescription pain medicines.  Check your skin and feet every day for cuts, bruises, redness, blisters, or sores.  Keep all follow up  visits as told by your health care provider. This is important. Contact a health care provider if:  You have burning, stabbing, or aching pain in your legs or feet.  You are unable to feel pressure or pain in your feet.  You develop problems with digestion, such as: ? Nausea. ? Vomiting. ? Bloating. ? Constipation. ? Diarrhea. ? Abdominal pain.  You have difficulty with urination, such as inability: ? To control when you urinate (incontinence). ? To completely empty the bladder (retention).  You have palpitations.  You feel dizzy, weak, or faint when you stand up. Get help right away if:  You cannot urinate.  You have sudden weakness or loss of coordination.  You have trouble speaking.  You have pain or pressure in your chest.  You have an irregular heart beat.  You have sudden inability to move a part of your body. Summary  Diabetic neuropathy refers to nerve damage that is caused by diabetes. It can affect nerves throughout the entire body, causing numbness and pain in the arms, legs, digestive tract, heart, and other body systems.  Keep your blood glucose level and your blood pressure in your target range, as directed by your health care provider. This can help prevent neuropathy from getting worse.  Check your skin and feet every day for cuts, bruises, redness, blisters, or sores.  Do not use any products that contain nicotine or tobacco, such as cigarettes and e-cigarettes. If you need help quitting, ask your health care provider. This information is not intended to replace advice given to you by your health care provider. Make sure you discuss any questions you have with your health care provider. Document Revised: 08/01/2017 Document Reviewed: 07/24/2016 Elsevier Patient Education  [place].  Diabetic Retinopathy Diabetic retinopathy is a disease of the retina. The retina is a light-sensitive membrane at the back of the eye. Retinopathy is a  complication of diabetes (diabetes mellitus) and a common cause of bad eyesight (visual impairment). It can eventually cause blindness. Early detection and treatment of diabetic retinopathy is important in keeping your eyes healthy and preventing further damage to them. What are the causes? Diabetic retinopathy is caused by blood sugar (glucose) levels that are too high for an extended period of time. High blood glucose over an extended period of time can:  Damage small blood vessels in the retina, allowing blood to leak through the vessel walls.  Cause new, abnormal blood vessels to grow on the retina. This can scar the retina in the advanced stage of diabetic retinopathy. What increases the risk? You are more likely to develop this condition if:  You have had diabetes for a long time.  You have poorly controlled blood glucose.  You have high blood pressure. What are the signs or symptoms? In the early stages of diabetic retinopathy, there are often no symptoms. As the condition gets worse, symptoms may include:  Blurred vision. This is usually caused by swelling due to abnormal blood glucose levels. The blurriness may go away when blood glucose levels return to normal.  Moving specks or dark spots (floaters) in your vision. These can be caused by a small amount of bleeding (hemorrhage) from retinal blood vessels.  Missing parts of your field of vision, such as vision at the sides of the eyes. This can be caused by larger retinal hemorrhages.  Difficulty reading.  Double vision.  Pain in one or both eyes.  Feeling pressure in one or both eyes.  Trouble seeing straight lines. Straight lines may not look straight.  Redness of the eyes that does not go away. How is this diagnosed? This condition may be diagnosed with an eye exam in which your eye care specialist puts drops in your eyes that enlarge (dilate) your pupils. This lets your health care provider examine your retina and  check for changes in your retinal blood vessels. How is this treated? This condition may be treated by:  Keeping your blood glucose and blood pressure within a target range.  Using a type of laser beam to seal your retinal blood vessels. This stops them from bleeding and decreases pressure in your eye.  Getting shots of medicine in the eye  to reduce swelling of the center of the retina (macula). You may be given: ? Anti-VEGF medicine. This medicine can help slow vision loss, and may even improve vision. ? Steroid medicine. Follow these instructions at home:   Follow your diabetes management plan as directed by your health care provider. This may include exercising regularly and eating a healthy diet.  Keep your blood glucose level and your blood pressure in your target range, as directed by your health care provider.  Check your blood glucose as often as directed.  Take over the counter and prescription medicines only as told by your health care provider. This includes insulin and oral diabetes medicine.  Get your eyes checked at least once every year. An eye specialist can usually see diabetic retinopathy developing long before it starts to cause problems. In many cases, it can be treated to prevent complications from occurring.  Do not use any products that contain nicotine or tobacco, such as cigarettes and e-cigarettes. If you need help quitting, ask your health care provider.  Keep all follow-up visits as told by your health care provider. This is important. Contact a health care provider if:  You notice gradual blurring or other changes in your vision over time.  You notice that your glasses or contact lenses do not make things look as sharp as they once did.  You have trouble reading or seeing details at a distance with either eye.  You notice a change in your vision or notice that parts of your field of vision appear missing or hazy.  You suddenly see moving specks or  dark spots in the field of vision of either eye. Get help right away if:  You have sudden pain or pressure in one or both eyes.  You suddenly lose vision or a curtain or veil seems to come across your eyes.  You have a sudden burst of floaters in your vision. Summary  Diabetic retinopathy is a disease of the retina. The retina is a light-sensitive membrane at the back of the eye. Retinopathy is a complication of diabetes.  Get your eyes checked at least once every year. An eye specialist can usually see diabetic retinopathy developing long before it starts to cause problems. In many cases, it can be treated to prevent complications from occurring.  Keep your blood glucose and your blood pressure in target range. Follow your diabetes management plan as directed by your health care provider.  Protect your eyes. Wear sunglasses and eye protection when needed. This information is not intended to replace advice given to you by your health care provider. Make sure you discuss any questions you have with your health care provider. Document Revised: 08/01/2017 Document Reviewed: 07/24/2016 Elsevier Patient Education  [place].  Hyperglycemia Hyperglycemia occurs when the level of sugar (glucose) in the blood is too high. Glucose is a type of sugar that provides the body's main source of energy. Certain hormones (insulin and glucagon) control the level of glucose in the blood. Insulin lowers blood glucose, and glucagon increases blood glucose. Hyperglycemia can result from having too little insulin in the bloodstream, or from the body not responding normally to insulin. Hyperglycemia occurs most often in people who have diabetes (diabetes mellitus), but it can happen in people who do not have diabetes. It can develop quickly, and it can be life-threatening if it causes you to become severely dehydrated (diabetic ketoacidosis or hyperglycemic hyperosmolar state). Severe hyperglycemia is a  medical emergency. What are the causes?  If you have diabetes, hyperglycemia may be caused by:  Diabetes medicine.  Medicines that increase blood glucose or affect your diabetes control.  Not eating enough, or not eating often enough.  Changes in physical activity level.  Being sick or having an infection. If you have prediabetes or undiagnosed diabetes:  Hyperglycemia may be caused by those conditions. If you do not have diabetes, hyperglycemia may be caused by:  Certain medicines, including steroid medicines, beta-blockers, epinephrine, and thiazide diuretics.  Stress.  Serious illness.  Surgery.  Diseases of the pancreas.  Infection. What increases the risk? Hyperglycemia is more likely to develop in people who have risk factors for diabetes, such as:  Having a family member with diabetes.  Having a gene for type 1 diabetes that is passed from parent to child (inherited).  Living in an area with cold weather conditions.  Exposure to certain viruses.  Certain conditions in which the body's disease-fighting (immune) system attacks itself (autoimmune disorders).  Being overweight or obese.  Having an inactive (sedentary) lifestyle.  Having been diagnosed with insulin resistance.  Having a history of prediabetes, gestational diabetes, or polycystic ovarian syndrome (PCOS).  Being of American-Indian, African-American, Hispanic/Latino, or Asian/Pacific Islander descent. What are the signs or symptoms? Hyperglycemia may not cause any symptoms. If you do have symptoms, they may include early warning signs, such as:  Increased thirst.  Hunger.  Feeling very tired.  Needing to urinate more often than usual.  Blurry vision. Other symptoms may develop if hyperglycemia gets worse, such as:  Dry mouth.  Loss of appetite.  Fruity-smelling breath.  Weakness.  Unexpected or rapid weight gain or weight loss.  Tingling or numbness in the hands or  feet.  Headache.  Skin that does not quickly return to normal after being lightly pinched and released (poor skin turgor).  Abdominal pain.  Cuts or bruises that are slow to heal. How is this diagnosed? Hyperglycemia is diagnosed with a blood test to measure your blood glucose level. This blood test is usually done while you are having symptoms. Your health care provider may also do a physical exam and review your medical history. You may have more tests to determine the cause of your hyperglycemia, such as:  A fasting blood glucose (FBG) test. You will not be allowed to eat (you will fast) for at least 8 hours before a blood sample is taken.  An A1c (hemoglobin A1c) blood test. This provides information about blood glucose control over the previous 2-3 months.  An oral glucose tolerance test (OGTT). This measures your blood glucose at two times: ? After fasting. This is your baseline blood glucose level. ? Two hours after drinking a beverage that contains glucose. How is this treated? Treatment depends on the cause of your hyperglycemia. Treatment may include:  Taking medicine to regulate your blood glucose levels. If you take insulin or other diabetes medicines, your medicine or dosage may be adjusted.  Lifestyle changes, such as exercising more, eating healthier foods, or losing weight.  Treating an illness or infection, if this caused your hyperglycemia.  Checking your blood glucose more often.  Stopping or reducing steroid medicines, if these caused your hyperglycemia. If your hyperglycemia becomes severe and it results in hyperglycemic hyperosmolar state, you must be hospitalized and given IV fluids. Follow these instructions at home:  General instructions  Take over-the-counter and prescription medicines only as told by your health care provider.  Do not use any products that contain nicotine or tobacco, such  as cigarettes and e-cigarettes. If you need help quitting, ask  your health care provider.  Limit alcohol intake to no more than 1 drink per day for nonpregnant women and 2 drinks per day for men. One drink equals 12 oz of beer, 5 oz of wine, or 1 oz of hard liquor.  Learn to manage stress. If you need help with this, ask your health care provider.  Keep all follow-up visits as told by your health care provider. This is important. Eating and drinking   Maintain a healthy weight.  Exercise regularly, as directed by your health care provider.  Stay hydrated, especially when you exercise, get sick, or spend time in hot temperatures.  Eat healthy foods, such as: ? Lean proteins. ? Complex carbohydrates. ? Fresh fruits and vegetables. ? Low-fat dairy products. ? Healthy fats.  Drink enough fluid to keep your urine clear or pale yellow. If you have diabetes:  Make sure you know the symptoms of hyperglycemia.  Follow your diabetes management plan, as told by your health care provider. Make sure you: ? Take your insulin and medicines as directed. ? Follow your exercise plan. ? Follow your meal plan. Eat on time, and do not skip meals. ? Check your blood glucose as often as directed. Make sure to check your blood glucose before and after exercise. If you exercise longer or in a different way than usual, check your blood glucose more often. ? Follow your sick day plan whenever you cannot eat or drink normally. Make this plan in advance with your health care provider.  Share your diabetes management plan with people in your workplace, school, and household.  Check your urine for ketones when you are ill and as told by your health care provider.  Carry a medical alert card or wear medical alert jewelry. Contact a health care provider if:  Your blood glucose is at or above 240 mg/dL (13.3 mmol/L) for 2 days in a row.  You have problems keeping your blood glucose in your target range.  You have frequent episodes of hyperglycemia. Get help right  away if:  You have difficulty breathing.  You have a change in how you think, feel, or act (mental status).  You have nausea or vomiting that does not go away. These symptoms may represent a serious problem that is an emergency. Do not wait to see if the symptoms will go away. Get medical help right away. Call your local emergency services (911 in the [place] Do not drive yourself to the hospital. Summary  Hyperglycemia occurs when the level of sugar (glucose) in the blood is too high.  Hyperglycemia is diagnosed with a blood test to measure your blood glucose level. This blood test is usually done while you are having symptoms. Your health care provider may also do a physical exam and review your medical history.  If you have diabetes, follow your diabetes management plan as told by your health care provider.  Contact your health care provider if you have problems keeping your blood glucose in your target range. This information is not intended to replace advice given to you by your health care provider. Make sure you discuss any questions you have with your health care provider. Document Revised: 03/06/2016 Document Reviewed: 03/06/2016 Elsevier Patient Education  2020 [place] With Diabetes Diabetes (type 1 diabetes mellitus or type 2 diabetes mellitus) is a condition in which the body does not have enough of a hormone called insulin, or the  body does not respond properly to insulin. Normally, insulin allows sugars (glucose) to enter cells in the body. The cells use glucose for energy. With diabetes, extra glucose builds up in the blood instead of going into cells, which results in high blood glucose (hyperglycemia). How to manage lifestyle changes Managing diabetes includes medical treatments as well as lifestyle changes. If diabetes is not managed well, serious physical and emotional complications can occur. Taking good care of yourself means that you are responsible  for:  Monitoring glucose regularly.  Eating a healthy diet.  Exercising regularly.  Meeting with health care providers.  Taking medicines as directed. Some people may feel a lot of stress about managing their diabetes. This is known as emotional distress, and it is very common. Living with diabetes can place you at risk for emotional distress, depression, or anxiety. These disorders can be confusing and can make diabetes management more difficult. How to recognize stress Emotional distress Symptoms of emotional distress include:  Anger about having a diagnosis of diabetes.  Fear or frustration about your diagnosis and the changes you need to make to manage the condition.  Being overly worried about the care that you need or the cost of the care that you need.  Feeling like you caused your condition by doing something wrong.  Fear of unpredictable situations, like low or high blood glucose.  Feeling judged by your health care providers.  Feeling very alone with the disease.  Getting too tired or worn out with the demands of daily care. Depression Having diabetes means that you are at a higher risk for depression. Having depression also means that you are at a higher risk for diabetes. Your health care provider may test (screen) you for symptoms of depression. It is important to recognize depression symptoms and to start treatment for depression soon after it is diagnosed. The following are some symptoms of depression:  Loss of interest in things that you used to enjoy.  Trouble sleeping, or often waking up early and not being able to get back to sleep.  A change in appetite.  Feeling tired most of the day.  Feeling nervous and anxious.  Feeling guilty and worrying that you are a burden to others.  Feeling depressed more often than you do not feel that way.  Thoughts of hurting yourself or feeling that you want to die. If you have any of these symptoms for 2 weeks or  longer, reach out to a health care provider. Follow these instructions at home: Managing emotional distress The following are some ways to manage emotional distress:  Talk with your health care provider or certified diabetes educator. Consider working with a counselor or therapist.  Learn as much as you can about diabetes and its treatment. Meet with a certified diabetes educator or take a class to learn how to manage your condition.  Keep a journal of your thoughts and concerns.  Accept that some things are out of your control.  Talk with other people who have diabetes. It can help to talk with others about the emotional distress that you feel.  Find ways to manage stress that work for you. These may include art or music therapy, exercise, meditation, and hobbies.  Seek support from spiritual leaders, family, and friends. General instructions  Follow your diabetes management plan.  Keep all follow-up visits as told by your health care provider. This is important. Where to find support   Ask your health care provider to recommend a therapist  who understands both depression and diabetes.  Search for information and support from the American Diabetes Association: www.diabetes.org  Find a certified diabetes educator and make an appointment through [place] [employer] Educators: www.diabeteseducator.org Get help right away if:  You have thoughts about hurting yourself or others. If you ever feel like you may hurt yourself or others, or have thoughts about taking your own life, get help right away. You can go to your nearest emergency department or call:  Your local emergency services (911 in the [place]  A suicide crisis helpline, such as the [place] at (564)111-8263. This is open 24 hours a day. Summary  Diabetes (type 1 diabetes mellitus or type 2 diabetes mellitus) is a condition in which the body does not have enough of a hormone  called insulin, or the body does not respond properly to insulin.  Living with diabetes puts you at risk for medical issues, and it also puts you at risk for emotional issues such as emotional distress, depression, and anxiety.  Recognizing the symptoms of emotional distress and depression may help you avoid problems with your diabetes control. It is important to start treatment for emotional distress and depression soon after they are diagnosed.  Having diabetes means that you are at a higher risk for depression. Ask your health care provider to recommend a therapist who understands both depression and diabetes.  If you experience symptoms of emotional distress or depression, it is important to discuss this with your health care provider, certified diabetes educator, or therapist. This information is not intended to replace advice given to you by your health care provider. Make sure you discuss any questions you have with your health care provider. Document Revised: 07/01/2018 Document Reviewed: 11/02/2016 Elsevier Patient Education  [place].  Preventing Diabetes Mellitus Complications You can take action to prevent or slow down problems that are caused by diabetes (diabetes mellitus). Following your diabetes plan and taking care of yourself can reduce your risk of serious or life-threatening complications. What actions can I take to prevent diabetes complications? Manage your diabetes   Follow instructions from your health care providers about managing your diabetes. Your diabetes may be managed by a team of health care providers who can teach you how to care for yourself and can answer questions that you have.  Educate yourself about your condition so you can make healthy choices about eating and physical activity.  Check your blood sugar (glucose) levels as often as directed. Your health care provider will help you decide how often to check your blood glucose level depending on  your treatment goals and how well you are meeting them.  Ask your health care provider if you should take low-dose aspirin daily and what dose is recommended for you. Taking low-dose aspirin daily is recommended to help prevent cardiovascular disease. Do not use nicotine or tobacco Do not use any products that contain nicotine or tobacco, such as cigarettes and e-cigarettes. If you need help quitting, ask your health care provider. Nicotine raises your risk for diabetes problems. If you quit using nicotine:  You will lower your risk for heart attack, stroke, nerve disease, and kidney disease.  Your cholesterol and blood pressure may improve.  Your blood circulation will improve. Keep your blood pressure under control Your personal target blood pressure is determined based on:  Your age.  Your medicines.  How long you have had diabetes.  Any other medical conditions you have. To control your blood  pressure:  Follow instructions from your health care provider about meal planning, exercise, and medicines.  Make sure your health care provider checks your blood pressure at every medical visit.  Monitor your blood pressure at home as told by your health care provider.  Keep your cholesterol under control To control your cholesterol:  Follow instructions from your health care provider about meal planning, exercise, and medicines.  Have your cholesterol checked at least once a year.  You may be prescribed medicine to lower cholesterol (statin). If you are not taking a statin, ask your health care provider if you should be. Controlling your cholesterol may:  Help prevent heart disease and stroke. These are the most common health problems for people with diabetes.  Improve your blood flow. Schedule and keep yearly physical exams and eye exams Your health care provider will tell you how often you need medical visits depending on your diabetes management plan. Keep all follow-up visits  as directed. This is important so possible problems can be identified early and complications can be avoided or treated.  Every visit with your health care provider should include measuring your: ? Weight. ? Blood pressure. ? Blood glucose control.  Your A1c (hemoglobin A1c) level should be checked: ? At least 2 times a year, if you are meeting your treatment goals. ? 4 times a year, if you are not meeting treatment goals or if your treatment goals have changed.  Your blood lipids (lipid profile) should be checked yearly. You should also be checked yearly for protein in your urine (urine microalbumin).  If you have type 1 diabetes, get an eye exam 3-5 years after you are diagnosed, and then once a year after your first exam.  If you have type 2 diabetes, get an eye exam as soon as you are diagnosed, and then once a year after your first exam. Keep your vaccines current It is recommended that you receive:  A flu (influenza) vaccine every year.  A pneumonia (pneumococcal) vaccine and a hepatitis B vaccine. If you are age 34 or older, you may get the pneumonia vaccine as a series of two separate shots. Ask your health care provider which other vaccines may be recommended. Take care of your feet Diabetes may cause you to have poor blood circulation to your legs and feet. Because of this, taking care of your feet is very important. Diabetes can cause:  The skin on the feet to get thinner, break more easily, and heal more slowly.  Nerve damage in your legs and feet, which results in decreased feeling. You may not notice minor injuries that could lead to serious problems. To avoid foot problems:  Check your skin and feet every day for cuts, bruises, redness, blisters, or sores.  Schedule a foot exam with your health care provider once every year. This exam includes: ? Inspecting of the structure and skin of your feet. ? Checking the pulses and sensation in your feet.  Make sure that  your health care provider performs a visual foot exam at every medical visit.  Take care of your teeth People with poorly controlled diabetes are more likely to have gum (periodontal) disease. Diabetes can make periodontal diseases harder to control. If not treated, periodontal diseases can lead to tooth loss. To prevent this:  Brush your teeth twice a day.  Floss at least once a day.  Visit your dentist 2 times a year. Drink responsibly Limit alcohol intake to no more than 1 drink a day  for nonpregnant women and 2 drinks a day for men. One drink equals 12 oz of beer, 5 oz of wine, or 1 oz of hard liquor.  It is important to eat food when you drink alcohol to avoid low blood glucose (hypoglycemia). Avoid alcohol if you:  Have a history of alcohol abuse or dependence.  Are pregnant.  Have liver disease, pancreatitis, advanced neuropathy, or severe hypertriglyceridemia. Lessen stress Living with diabetes can be stressful. When you are experiencing stress, your blood glucose may be affected in two ways:  Stress hormones may cause your blood glucose to rise.  You may be distracted from taking good care of yourself. Be aware of your stress level and make changes to help you manage challenging situations. To lower your stress levels:  Consider joining a support group.  Do planned relaxation or meditation.  Do a hobby that you enjoy.  Maintain healthy relationships.  Exercise regularly.  Work with your health care provider or a mental health professional. Summary  You can take action to prevent or slow down problems that are caused by diabetes (diabetes mellitus). Following your diabetes plan and taking care of yourself can reduce your risk of serious or life-threatening complications.  Follow instructions from your health care providers about managing your diabetes. Your diabetes may be managed by a team of health care providers who can teach you how to care for yourself and can  answer questions that you have.  Your health care provider will tell you how often you need medical visits depending on your diabetes management plan. Keep all follow-up visits as directed. This is important so possible problems can be identified early and complications can be avoided or treated. This information is not intended to replace advice given to you by your health care provider. Make sure you discuss any questions you have with your health care provider. Document Revised: 09/17/2017 Document Reviewed: 03/18/2016 Elsevier Patient Education  [place].  Preventing Type 2 Diabetes Mellitus Type 2 diabetes (type 2 diabetes mellitus) is a long-term (chronic) disease that affects blood sugar (glucose) levels. Normally, a hormone called insulin allows glucose to enter cells in the body. The cells use glucose for energy. In type 2 diabetes, one or both of these problems may be present:  The body does not make enough insulin.  The body does not respond properly to insulin that it makes (insulin resistance). Insulin resistance or lack of insulin causes excess glucose to build up in the blood instead of going into cells. As a result, high blood glucose (hyperglycemia) develops, which can cause many complications. Being overweight or obese and having an inactive (sedentary) lifestyle can increase your risk for diabetes. Type 2 diabetes can be delayed or prevented by making certain nutrition and lifestyle changes. What nutrition changes can be made?   Eat healthy meals and snacks regularly. Keep a healthy snack with you for when you get hungry between meals, such as fruit or a handful of nuts.  Eat lean meats and proteins that are low in saturated fats, such as chicken, fish, egg whites, and beans. Avoid processed meats.  Eat plenty of fruits and vegetables and plenty of grains that have not been processed (whole grains). It is recommended that you eat: ? 1?2 cups of fruit every  day. ? 2?3 cups of vegetables every day. ? 6?8 oz of whole grains every day, such as oats, whole wheat, bulgur, brown rice, quinoa, and millet.  Eat low-fat dairy products, such as milk,  yogurt, and cheese.  Eat foods that contain healthy fats, such as nuts, avocado, olive oil, and canola oil.  Drink water throughout the day. Avoid drinks that contain added sugar, such as soda or sweet tea.  Follow instructions from your health care provider about specific eating or drinking restrictions.  Control how much food you eat at a time (portion size). ? Check food labels to find out the serving sizes of foods. ? Use a kitchen scale to weigh amounts of foods.  Saute or steam food instead of frying it. Cook with water or broth instead of oils or butter.  Limit your intake of: ? Salt (sodium). Have no more than 1 tsp (2,400 mg) of sodium a day. If you have heart disease or high blood pressure, have less than ? tsp (1,500 mg) of sodium a day. ? Saturated fat. This is fat that is solid at room temperature, such as butter or fat on meat. What lifestyle changes can be made? Activity   Do moderate-intensity physical activity for at least 30 minutes on at least 5 days of the week, or as much as told by your health care provider.  Ask your health care provider what activities are safe for you. A mix of physical activities may be best, such as walking, swimming, cycling, and strength training.  Try to add physical activity into your day. For example: ? Park in spots that are farther away than usual, so that you walk more. For example, park in a far corner of the parking lot when you go to the office or the grocery store. ? Take a walk during your lunch break. ? Use stairs instead of elevators or escalators. Weight Loss  Lose weight as directed. Your health care provider can determine how much weight loss is best for you and can help you lose weight safely.  If you are overweight or obese, you  may be instructed to lose at least 5?7 % of your body weight. Alcohol and Tobacco   Limit alcohol intake to no more than 1 drink a day for nonpregnant women and 2 drinks a day for men. One drink equals 12 oz of beer, 5 oz of wine, or 1 oz of hard liquor.  Do not use any tobacco products, such as cigarettes, chewing tobacco, and e-cigarettes. If you need help quitting, ask your health care provider. Work With [place] Provider  Have your blood glucose tested regularly, as told by your health care provider.  Discuss your risk factors and how you can reduce your risk for diabetes.  Get screening tests as told by your health care provider. You may have screening tests regularly, especially if you have certain risk factors for type 2 diabetes.  Make an appointment with a diet and nutrition specialist (registered dietitian). A registered dietitian can help you make a healthy eating plan and can help you understand portion sizes and food labels. Why are these changes important?  It is possible to prevent or delay type 2 diabetes and related health problems by making lifestyle and nutrition changes.  It can be difficult to recognize signs of type 2 diabetes. The best way to avoid possible damage to your body is to take actions to prevent the disease before you develop symptoms. What can happen if changes are not made?  Your blood glucose levels may keep increasing. Having high blood glucose for a long time is dangerous. Too much glucose in your blood can damage your blood vessels, heart,  kidneys, nerves, and eyes.  You may develop prediabetes or type 2 diabetes. Type 2 diabetes can lead to many chronic health problems and complications, such as: ? Heart disease. ? Stroke. ? Blindness. ? Kidney disease. ? Depression. ? Poor circulation in the feet and legs, which could lead to surgical removal (amputation) in severe cases. Where to find support  Ask your health care provider to  recommend a registered dietitian, diabetes educator, or weight loss program.  Look for local or online weight loss groups.  Join a gym, fitness club, or outdoor activity group, such as a walking club. Where to find more information To learn more about diabetes and diabetes prevention, visit:  American Diabetes Association (ADA): www.diabetes.[employer] of Diabetes and Digestive and Kidney Diseases: FindSpin.nl To learn more about healthy eating, visit:  The U.[place]. Department of Agriculture Scientist, research (physical sciences)), Choose My Plate: http://wiley-williams.com/  Office of Disease Prevention and Health Promotion (ODPHP), Dietary Guidelines: SurferLive.at Summary  You can reduce your risk for type 2 diabetes by increasing your physical activity, eating healthy foods, and losing weight as directed.  Talk with your health care provider about your risk for type 2 diabetes. Ask about any blood tests or screening tests that you need to have. This information is not intended to replace advice given to you by your health care provider. Make sure you discuss any questions you have with your health care provider. Document Revised: 10/11/2018 Document Reviewed: 08/10/2015 Elsevier Patient Education  [place].  Type 2 Diabetes Mellitus, Self Care, Adult Caring for yourself after you have been diagnosed with type 2 diabetes (type 2 diabetes mellitus) means keeping your blood sugar (glucose) under control with a balance of:  Nutrition.  Exercise.  Lifestyle changes.  Medicines or insulin, if necessary.  Support from your team of health care providers and others. The following information explains what you need to know to manage your diabetes at home. What are the risks? Having diabetes can put you at risk for other long-term (chronic) conditions, such as heart disease and kidney disease. Your health care provider may prescribe  medicines to help prevent complications from diabetes. These medicines may include:  Aspirin.  Medicine to lower cholesterol.  Medicine to control blood pressure. How to monitor blood glucose   Check your blood glucose every day, as often as told by your health care provider.  Have your A1c (hemoglobin A1c) level checked two or more times a year, or as often as told by your health care provider. Your health care provider will set individualized treatment goals for you. Generally, the goal of treatment is to maintain the following blood glucose levels:  Before meals (preprandial): 80-130 mg/dL (4.4-7.2 mmol/L).  After meals (postprandial): below 180 mg/dL (10 mmol/L).  A1c level: less than 7%. How to manage hyperglycemia and hypoglycemia Hyperglycemia symptoms Hyperglycemia, also called high blood glucose, occurs when blood glucose is too high. Make sure you know the early signs of hyperglycemia, such as:  Increased thirst.  Hunger.  Feeling very tired.  Needing to urinate more often than usual.  Blurry vision. Hypoglycemia symptoms Hypoglycemia, also called low blood glucose, occurswith a blood glucose level at or below 70 mg/dL (3.9 mmol/L). The risk for hypoglycemia increases during or after exercise, during sleep, during illness, and when skipping meals or not eating for a long time (fasting). It is important to know the symptoms of hypoglycemia and treat it right away. Always have a 15-gram rapid-acting carbohydrate snack with you to  treat low blood glucose. Family members and close friends should also know the symptoms and should understand how to treat hypoglycemia, in case you are not able to treat yourself. Symptoms may include:  Hunger.  Anxiety.  Sweating and feeling clammy.  Confusion.  Dizziness or feeling light-headed.  Sleepiness.  Nausea.  Increased heart rate.  Headache.  Blurry vision.  Irritability.  A change in coordination.  Tingling  or numbness around the mouth, lips, or tongue.  Restless sleep.  Fainting.  Seizure. Treating hypoglycemia If you are alert and able to swallow safely, follow the 15:15 rule:  Take 15 grams of a rapid-acting carbohydrate. Talk with your health care provider about how much you should take.  Rapid-acting options include: ? Glucose pills (take 15 grams). ? 6-8 pieces of hard candy. ? 4-6 oz (120-150 mL) of fruit juice. ? 4-6 oz (120-150 mL) of regular (not diet) soda. ? 1 Tbsp (15 mL) honey or sugar.  Check your blood glucose 15 minutes after you take the carbohydrate.  If the repeat blood glucose level is still at or below 70 mg/dL (3.9 mmol/L), take 15 grams of a carbohydrate again.  If your blood glucose level does not increase above 70 mg/dL (3.9 mmol/L) after 3 tries, seek emergency medical care.  After your blood glucose level returns to normal, eat a meal or a snack within 1 hour. Treating severe hypoglycemia Severe hypoglycemia is when your blood glucose level is at or below 54 mg/dL (3 mmol/L). Severe hypoglycemia is an emergency. Do not wait to see if the symptoms will go away. Get medical help right away. Call your local emergency services (911 in the [place] If you have severe hypoglycemia and you cannot eat or drink, you may need an injection of glucagon. A family member or close friend should learn how to check your blood glucose and how to give you a glucagon injection. Ask your health care provider if you need to have an emergency glucagon injection kit available. Severe hypoglycemia may need to be treated in a hospital. The treatment may include getting glucose through an IV. You may also need treatment for the cause of your hypoglycemia. Follow these instructions at home: Take diabetes medicines as told  If your health care provider prescribed insulin or diabetes medicines, take them every day.  Do not run out of insulin or other diabetes medicines that you take. Plan  ahead so you always have these available.  If you use insulin, adjust your dosage based on how physically active you are and what foods you eat. Your health care provider will tell you how to adjust your dosage. Make healthy food choices  The things that you eat and drink affect your blood glucose and your insulin dosage. Making good choices helps to control your diabetes and prevent other health problems. A healthy meal plan includes eating lean proteins, complex carbohydrates, fresh fruits and vegetables, low-fat dairy products, and healthy fats. Make an appointment to see a diet and nutrition specialist (registered dietitian) to help you create an eating plan that is right for you. Make sure that you:  Follow instructions from your health care provider about eating or drinking restrictions.  Drink enough fluid to keep your urine pale yellow.  Keep a record of the carbohydrates that you eat. Do this by reading food labels and learning the standard serving sizes of foods.  Follow your sick day plan whenever you cannot eat or drink as usual. Make this plan in advance  with your health care provider.  Stay active Exercise regularly, as told by your health care provider. This may include:  Stretching and doing strength exercises, such as yoga or weightlifting, 2 or more times a week.  Doing 150 minutes or more of moderate-intensity or vigorous-intensity exercise each week. This could be brisk walking, biking, or water aerobics. ? Spread out your activity over 3 or more days of the week. ? Do not go more than 2 days in a row without doing some kind of physical activity. When you start a new exercise or activity, work with your health care provider to adjust your insulin, medicines, or food intake as needed. Make healthy lifestyle choices  Do not use any tobacco products, such as cigarettes, chewing tobacco, and e-cigarettes. If you need help quitting, ask your health care provider.  If your  health care provider says that alcohol is safe for you, limit alcohol intake to no more than 1 drink per day for nonpregnant women and 2 drinks per day for men. One drink equals 12 oz of beer (355 mL), 5 oz of wine (148 mL), or 1 oz of hard liquor (44 mL).  Learn to manage stress. If you need help with this, ask your health care provider. Care for your body   Keep your immunizations up to date. In addition to getting vaccinations as told by your health care provider, it is recommended that you get vaccinated against the following illnesses: ? The flu (influenza). Get a flu shot every year. ? Pneumonia. ? Hepatitis B.  Schedule an eye exam soon after your diagnosis, and then one time every year after that.  Check your skin and feet every day for cuts, bruises, redness, blisters, or sores. Schedule a foot exam with your health care provider once every year.  Brush your teeth and gums two times a day, and floss one or more times a day. Visit your dentist one or more times every 6 months.  Maintain a healthy weight. General instructions  Take over-the-counter and prescription medicines only as told by your health care provider.  Share your diabetes management plan with people in your workplace, school, and household.  Carry a medical alert card or wear medical alert jewelry.  Keep all follow-up visits as told by your health care provider. This is important. Questions to ask your health care provider  Do I need to meet with a diabetes educator?  Where can I find a support group for people with diabetes? Where to find more information For more information about diabetes, visit:  American Diabetes Association (ADA): www.diabetes.org  American Association of Diabetes Educators (AADE): www.diabeteseducator.org Summary  Caring for yourself after you have been diagnosed with (type 2 diabetes mellitus) means keeping your blood sugar (glucose) under control with a balance of nutrition,  exercise, lifestyle changes, and medicine.  Check your blood glucose every day, as often as told by your health care provider.  Having diabetes can put you at risk for other long-term (chronic) conditions, such as heart disease and kidney disease. Your health care provider may prescribe medicines to help prevent complications from diabetes.  Keep all follow-up visits as told by your health care provider. This is important. This information is not intended to replace advice given to you by your health care provider. Make sure you discuss any questions you have with your health care provider. Document Revised: 12/10/2017 Document Reviewed: 07/23/2015 Elsevier Patient Education  2020 [employer].

## 2019-11-01 LAB — COMPREHENSIVE METABOLIC PANEL
ALT: 26 IU/L (ref 0–32)
AST: 35 IU/L (ref 0–40)
Albumin/Globulin Ratio: 1.6 (ref 1.2–2.2)
Albumin: 4.6 g/dL (ref 3.8–4.9)
Alkaline Phosphatase: 97 IU/L (ref 39–117)
BUN/Creatinine Ratio: 10 (ref 9–23)
BUN: 8 mg/dL (ref 6–24)
Bilirubin Total: 0.2 mg/dL (ref 0.0–1.2)
CO2: 20 mmol/L (ref 20–29)
Calcium: 10.3 mg/dL — ABNORMAL HIGH (ref 8.7–10.2)
Chloride: 102 mmol/L (ref 96–106)
Creatinine, Ser: 0.82 mg/dL (ref 0.57–1.00)
GFR calc Af Amer: 96 mL/min/{1.73_m2} (ref >59)
GFR calc non Af Amer: 83 mL/min/{1.73_m2} (ref >59)
Globulin, Total: 2.8 g/dL (ref 1.5–4.5)
Glucose: 129 mg/dL — ABNORMAL HIGH (ref 65–99)
Potassium: 4.4 mmol/L (ref 3.5–5.2)
Sodium: 136 mmol/L (ref 134–144)
Total Protein: 7.4 g/dL (ref 6.0–8.5)

## 2019-11-01 LAB — CBC WITH DIFFERENTIAL/PLATELET
Basophils Absolute: 0.1 10*3/uL (ref 0.0–0.2)
Basos: 1 %
EOS (ABSOLUTE): 0.1 10*3/uL (ref 0.0–0.4)
Eos: 1 %
Hematocrit: 42.4 % (ref 34.0–46.6)
Hemoglobin: 13.8 g/dL (ref 11.1–15.9)
Immature Grans (Abs): 0 10*3/uL (ref 0.0–0.1)
Immature Granulocytes: 0 %
Lymphocytes Absolute: 6 10*3/uL — ABNORMAL HIGH (ref 0.7–3.1)
Lymphs: 54 %
MCH: 29.1 pg (ref 26.6–33.0)
MCHC: 32.5 g/dL (ref 31.5–35.7)
MCV: 90 fL (ref 79–97)
Monocytes Absolute: 0.6 10*3/uL (ref 0.1–0.9)
Monocytes: 5 %
Neutrophils Absolute: 4.3 10*3/uL (ref 1.4–7.0)
Neutrophils: 39 %
Platelets: 288 10*3/uL (ref 150–450)
RBC: 4.74 x10E6/uL (ref 3.77–5.28)
RDW: 13.5 % (ref 11.7–15.4)
WBC: 11 10*3/uL — ABNORMAL HIGH (ref 3.4–10.8)

## 2019-11-01 LAB — LIPID PANEL
Chol/HDL Ratio: 5 ratio — ABNORMAL HIGH (ref 0.0–4.4)
Cholesterol, Total: 194 mg/dL (ref 100–199)
HDL: 39 mg/dL — ABNORMAL LOW (ref >39)
LDL Chol Calc (NIH): 129 mg/dL — ABNORMAL HIGH (ref 0–99)
Triglycerides: 142 mg/dL (ref 0–149)
VLDL Cholesterol Cal: 26 mg/dL (ref 5–40)

## 2019-11-01 LAB — HIV ANTIBODY (ROUTINE TESTING W REFLEX): HIV Screen 4th Generation wRfx: NONREACTIVE

## 2019-11-01 LAB — TSH: TSH: 0.737 u[IU]/mL (ref 0.450–4.500)

## 2019-11-03 ENCOUNTER — Other Ambulatory Visit: Payer: Self-pay | Admitting: Registered Nurse

## 2019-11-03 DIAGNOSIS — E1169 Type 2 diabetes mellitus with other specified complication: Secondary | ICD-10-CM

## 2019-11-03 MED ORDER — ATORVASTATIN CALCIUM 20 MG PO TABS: 20.0000 mg | ORAL_TABLET | Freq: Every day | ORAL | 3 refills | Status: DC

## 2019-11-03 NOTE — Progress Notes (Signed)
Spoke with pt regarding the start of taking the cholesterol med. She has no question or concerns as of right now

## 2019-11-03 NOTE — Progress Notes (Signed)
ato 

## 2019-11-03 NOTE — Progress Notes (Signed)
Hello -  If we could call Ms Noxon - her lipids are a little high, she should start atorvastatin 20mg  PO with dinner every evening. We can recheck her lipids once every 6 months or so. Atorvastatin is generally well tolerated but with any side effects she should call me.  Thank you,  , NP

## 2019-11-15 ENCOUNTER — Other Ambulatory Visit: Payer: Self-pay | Admitting: Registered Nurse

## 2019-11-25 ENCOUNTER — Encounter: Payer: Self-pay | Admitting: Registered Nurse

## 2019-11-25 NOTE — Progress Notes (Signed)
New Patient Office Visit  Subjective:  Patient ID: Jacqueline [name], female    DOB: 06/26/1971  Age: 49 y.o. MRN: [id number]  CC:  Chief Complaint  Patient presents with  . New Patient (Initial Visit)    establish care and also to check blood sugars she went to the doctor and blood sugars were over 200    HPI Jacqueline [name] presents for visit to establish care.  History reviewed: remote history of seizure disorder. Feels that this is controlled well, follows with neurologist Dr. Brett Fairy regularly.   Notes that when she had been seen for lab work, her sugars came back over 200 - it was recommended that she establish with a PCP to monitor for t2dm.  No symptoms of t2dm at this time including neuropathies, visual changes, urinary changes, polydipsia, or any symptoms of hyper or hypoglycemia.  Does have a famhx of t2dm in her father.   Otherwise would like routine lab work done if possible.   Past Medical History:  Diagnosis Date  . Seizure disorder ([place])   . Seizures ([place])     Past Surgical History:  Procedure Laterality Date  . broken nose     in high school    Family History  Problem Relation Age of Onset  . Diabetes Father   . Seizures Brother     Social History   Socioeconomic History  . Marital status: Married    Spouse name: Legrand Como  . Number of children: 2  . Years of education: college  . Highest education level: Not on file  Occupational History  . Occupation: Inventory control    Comment: [employer]  Tobacco Use  . Smoking status: Former Smoker    Packs/day: 0.00    Types: E-cigarettes, Cigarettes    Quit date: 08/22/2016    Years since quitting: 3.2  . Smokeless tobacco: Never Used  . Tobacco comment: quit smoking february 2018  Substance and Sexual Activity  . Alcohol use: No  . Drug use: No  . Sexual activity: Not on file  Other Topics Concern  . Not on file  Social History Narrative   Patient is married Legrand Como) and lives at home with her  family.   Patient has 2 children.   Patient works in Pharmacist, hospital for [employer].   Patient has a college education.   Patient drinks 1 cup of caffeine daily.      Social Determinants of Health   Financial Resource Strain:   . Difficulty of Paying Living Expenses:   Food Insecurity:   . Worried About Charity fundraiser in the Last Year:   . Arboriculturist in the Last Year:   Transportation Needs:   . Film/video editor (Medical):   Marland Kitchen Lack of Transportation (Non-Medical):   Physical Activity:   . Days of Exercise per Week:   . Minutes of Exercise per Session:   Stress:   . Feeling of Stress :   Social Connections:   . Frequency of Communication with Friends and Family:   . Frequency of Social Gatherings with Friends and Family:   . Attends Religious Services:   . Active Member of Clubs or Organizations:   . Attends Archivist Meetings:   Marland Kitchen Marital Status:   Intimate Partner Violence:   . Fear of Current or Ex-Partner:   . Emotionally Abused:   Marland Kitchen Physically Abused:   . Sexually Abused:     ROS Review of Systems  Constitutional:  Negative.   HENT: Negative.   Eyes: Negative.   Respiratory: Negative.   Cardiovascular: Negative.   Gastrointestinal: Negative.   Endocrine: Negative.   Genitourinary: Negative.   Musculoskeletal: Negative.   Skin: Negative.   Allergic/Immunologic: Negative.   Neurological: Negative.   Hematological: Negative.   Psychiatric/Behavioral: Negative.   All other systems reviewed and are negative.   Objective:   Today's Vitals: BP 124/81   Pulse 88   Temp 97.8 F (36.6 C) (Temporal)   Resp 17   Ht '5\' 5"'$  (1.651 m)   Wt 175 lb (79.4 kg)   SpO2 99%   BMI 29.12 kg/m   Physical Exam Vitals and nursing note reviewed.  Constitutional:      General: She is not in acute distress.    Appearance: Normal appearance. She is normal weight. She is not ill-appearing, toxic-appearing or diaphoretic.  Cardiovascular:      Rate and Rhythm: Normal rate and regular rhythm.     Pulses: Normal pulses.     Heart sounds: Normal heart sounds. No murmur. No friction rub. No gallop.   Pulmonary:     Effort: Pulmonary effort is normal. No respiratory distress.     Breath sounds: Normal breath sounds. No stridor. No wheezing, rhonchi or rales.  Chest:     Chest wall: No tenderness.  Musculoskeletal:     Cervical back: Normal range of motion and neck supple.  Skin:    General: Skin is warm and dry.     Capillary Refill: Capillary refill takes less than 2 seconds.  Neurological:     General: No focal deficit present.     Mental Status: She is alert and oriented to person, place, and time. Mental status is at baseline.     Cranial Nerves: No cranial nerve deficit.  Psychiatric:        Mood and Affect: Mood normal.        Behavior: Behavior normal.        Thought Content: Thought content normal.        Judgment: Judgment normal.     Assessment & Plan:   Problem List Items Addressed This Visit    None    Visit Diagnoses    Newly diagnosed diabetes ([place])    -  Primary   Relevant Medications   metFORMIN (GLUCOPHAGE) 1000 MG tablet   blood glucose meter kit and supplies   Lipid screening       Relevant Orders   Lipid Panel (Completed)   Screening for viral disease       Relevant Orders   HIV antibody (with reflex) (Completed)   Screening for endocrine, metabolic and immunity disorder       Relevant Orders   TSH (Completed)   POCT glycosylated hemoglobin (Hb A1C) (Completed)   POCT glucose (manual entry) (Completed)   Comprehensive metabolic panel (Completed)   CBC with Differential (Completed)   History of elevated glucose       Relevant Orders   POCT glycosylated hemoglobin (Hb A1C) (Completed)   POCT glucose (manual entry) (Completed)   Encounter to establish care          Outpatient Encounter Medications as of 10/31/2019  Medication Sig  . DEPAKOTE ER 500 MG 24 hr tablet Take 2 tablets (1,000  mg total) by mouth daily.  Marland Kitchen topiramate (TOPAMAX) 100 MG tablet Take 1 tablet (100 mg total) by mouth 2 (two) times daily.  . blood glucose meter kit and supplies Dispense based on  patient and insurance preference. Use up to four times daily as directed. (FOR ICD-10 E10.9, E11.9).  Marland Kitchen LORazepam (ATIVAN) 0.5 MG tablet Take 1 tablet (0.5 mg total) by mouth daily. (Patient not taking: Reported on 10/31/2019)  . metFORMIN (GLUCOPHAGE) 1000 MG tablet Take 1 tablet (1,000 mg total) by mouth 2 (two) times daily with a meal.  . Prenatal Vit-Fe Fumarate-FA (PRENATAL VITAMIN PO) Take by mouth. One a day vitamin takes one tablet daily   No facility-administered encounter medications on file as of 10/31/2019.    Follow-up: No follow-ups on file.   PLAN  a1c today shows 12.4 - diagnostic of t2dm  Will start on metformin 1070m PO bid and encourage strict diet control.   Return in 3 mo for recheck  Start statin if lipids at all elevated  Spent substantial time discussing the pathophysiology of t2dm and the importance of controlling this disease  Patient encouraged to call clinic with any questions, comments, or concerns.  RMaximiano Coss NP

## 2019-11-26 ENCOUNTER — Other Ambulatory Visit: Payer: Self-pay | Admitting: Registered Nurse

## 2020-01-26 ENCOUNTER — Ambulatory Visit: Payer: BC Managed Care – PPO | Admitting: Registered Nurse

## 2020-01-26 ENCOUNTER — Other Ambulatory Visit: Payer: Self-pay

## 2020-01-26 ENCOUNTER — Encounter: Payer: Self-pay | Admitting: Registered Nurse

## 2020-01-26 VITALS — BP 111/74 | HR 96 | Temp 97.3°F | Resp 16 | Ht 65.0 in | Wt 160.0 lb

## 2020-01-26 DIAGNOSIS — E119 Type 2 diabetes mellitus without complications: Secondary | ICD-10-CM | POA: Diagnosis not present

## 2020-01-26 LAB — POCT GLYCOSYLATED HEMOGLOBIN (HGB A1C): Hemoglobin A1C: 6.3 % — AB (ref 4.0–5.6)

## 2020-01-26 LAB — GLUCOSE, POCT (MANUAL RESULT ENTRY): POC Glucose: 85 mg/dl (ref 70–99)

## 2020-01-26 NOTE — Patient Instructions (Signed)
° ° ° °  If you have lab work done today you will be contacted with your lab results within the next 2 weeks.  If you have not heard from us then please contact us. The fastest way to get your results is to register for My Chart. ° ° °IF you received an x-ray today, you will receive an invoice from [facility]. Please contact  Radiology at [phone] with questions or concerns regarding your invoice.  ° °IF you received labwork today, you will receive an invoice from [facility]. Please contact [facility] at [phone] with questions or concerns regarding your invoice.  ° °Our billing staff will not be able to assist you with questions regarding bills from these companies. ° °You will be contacted with the lab results as soon as they are available. The fastest way to get your results is to activate your My Chart account. Instructions are located on the last page of this paperwork. If you have not heard from us regarding the results in 2 weeks, please contact this office. °  ° ° ° °

## 2020-03-30 ENCOUNTER — Encounter: Payer: Self-pay | Admitting: Registered Nurse

## 2020-03-30 NOTE — Progress Notes (Signed)
Established Patient Office Visit  Subjective:  Patient ID: Jacqueline [name], female    DOB: 1970-07-24  Age: 49 y.o. MRN: [id number]  CC:  Chief Complaint  Patient presents with   Follow-up    patient is here for 2 month follow up for diabetes. Patient has no questions or concerns.    HPI Jacqueline [name] presents for follow up on t2dm Good compliance with medications, has made substantial efforts to improve diet and exercise.  No AEs  Past Medical History:  Diagnosis Date   Seizure disorder ([place])    Seizures ([place])     Past Surgical History:  Procedure Laterality Date   broken nose     in high school    Family History  Problem Relation Age of Onset   Diabetes Father    Seizures Brother     Social History   Socioeconomic History   Marital status: Married    Spouse name: Legrand Como   Number of children: 2   Years of education: college   Highest education level: Not on file  Occupational History   Occupation: Inventory control    Comment: [employer]  Tobacco Use   Smoking status: Former Smoker    Packs/day: 0.00    Types: E-cigarettes, Cigarettes    Quit date: 08/22/2016    Years since quitting: 3.6   Smokeless tobacco: Never Used   Tobacco comment: quit smoking february 2018  Substance and Sexual Activity   Alcohol use: No   Drug use: No   Sexual activity: Not on file  Other Topics Concern   Not on file  Social History Narrative   Patient is married Legrand Como) and lives at home with her family.   Patient has 2 children.   Patient works in Pharmacist, hospital for [employer].   Patient has a college education.   Patient drinks 1 cup of caffeine daily.      Social Determinants of Health   Financial Resource Strain:    Difficulty of Paying Living Expenses: Not on file  Food Insecurity:    Worried About Charity fundraiser in the Last Year: Not on file   [employer] of Food in the Last Year: Not on file  Transportation Needs:    Lack of  Transportation (Medical): Not on file   Lack of Transportation (Non-Medical): Not on file  Physical Activity:    Days of Exercise per Week: Not on file   Minutes of Exercise per Session: Not on file  Stress:    Feeling of Stress : Not on file  Social Connections:    Frequency of Communication with Friends and Family: Not on file   Frequency of Social Gatherings with Friends and Family: Not on file   Attends Religious Services: Not on file   Active Member of [place] or Organizations: Not on file   Attends Archivist Meetings: Not on file   Marital Status: Not on file  Intimate Partner Violence:    Fear of Current or Ex-Partner: Not on file   Emotionally Abused: Not on file   Physically Abused: Not on file   Sexually Abused: Not on file    Outpatient Medications Prior to Visit  Medication Sig Dispense Refill   ACCU-CHEK GUIDE test strip USE TO TEST BLOOD GLUCOSE UP TO 4 TIMES A DAY 100 strip 2   Accu-Chek Softclix Lancets lancets USE AS DIRECTED UP TO 4 TIMES A DAY 100 each 1   atorvastatin (LIPITOR) 20 MG tablet Take  1 tablet (20 mg total) by mouth daily. 90 tablet 3   blood glucose meter kit and supplies Dispense based on patient and insurance preference. Use up to four times daily as directed. (FOR ICD-10 E10.9, E11.9). 1 each 0   DEPAKOTE ER 500 MG 24 hr tablet Take 2 tablets (1,000 mg total) by mouth daily. 180 tablet 3   metFORMIN (GLUCOPHAGE) 1000 MG tablet Take 1 tablet (1,000 mg total) by mouth 2 (two) times daily with a meal. 180 tablet 1   Prenatal Vit-Fe Fumarate-FA (PRENATAL VITAMIN PO) Take by mouth. One a day vitamin takes one tablet daily     topiramate (TOPAMAX) 100 MG tablet Take 1 tablet (100 mg total) by mouth 2 (two) times daily. 180 tablet 3   LORazepam (ATIVAN) 0.5 MG tablet Take 1 tablet (0.5 mg total) by mouth daily. (Patient not taking: Reported on 10/31/2019) 7 tablet 0   No facility-administered medications prior to visit.     No Known Allergies  ROS Review of Systems  Constitutional: Negative.   HENT: Negative.   Eyes: Negative.   Respiratory: Negative.   Cardiovascular: Negative.   Gastrointestinal: Negative.   Genitourinary: Negative.   Musculoskeletal: Negative.   Skin: Negative.   Neurological: Negative.   Psychiatric/Behavioral: Negative.       Objective:    Physical Exam Vitals and nursing note reviewed.  Constitutional:      General: She is not in acute distress.    Appearance: Normal appearance. She is normal weight. She is not ill-appearing, toxic-appearing or diaphoretic.  Cardiovascular:     Rate and Rhythm: Normal rate and regular rhythm.     Heart sounds: Normal heart sounds. No murmur heard.  No friction rub. No gallop.   Pulmonary:     Effort: Pulmonary effort is normal. No respiratory distress.     Breath sounds: Normal breath sounds. No stridor. No wheezing, rhonchi or rales.  Chest:     Chest wall: No tenderness.  Skin:    General: Skin is warm and dry.  Neurological:     General: No focal deficit present.     Mental Status: She is alert and oriented to person, place, and time. Mental status is at baseline.  Psychiatric:        Mood and Affect: Mood normal.        Behavior: Behavior normal.        Thought Content: Thought content normal.        Judgment: Judgment normal.     BP 111/74    Pulse 96    Temp (!) 97.3 F (36.3 C) (Temporal)    Resp 16    Ht '5\' 5"'  (1.651 m)    Wt 160 lb (72.6 kg)    SpO2 99%    BMI 26.63 kg/m  Wt Readings from Last 3 Encounters:  01/26/20 160 lb (72.6 kg)  10/31/19 175 lb (79.4 kg)  10/22/19 174 lb (78.9 kg)     Health Maintenance Due  Topic Date Due   URINE MICROALBUMIN  Never done   PAP SMEAR-Modifier  Never done   INFLUENZA VACCINE  Never done    There are no preventive care reminders to display for this patient.  Lab Results  Component Value Date   TSH 0.737 10/31/2019   Lab Results  Component Value Date    WBC 11.0 (H) 10/31/2019   HGB 13.8 10/31/2019   HCT 42.4 10/31/2019   MCV 90 10/31/2019   PLT 288 10/31/2019  Lab Results  Component Value Date   NA 136 10/31/2019   K 4.4 10/31/2019   CO2 20 10/31/2019   GLUCOSE 129 (H) 10/31/2019   BUN 8 10/31/2019   CREATININE 0.82 10/31/2019   BILITOT 0.2 10/31/2019   ALKPHOS 97 10/31/2019   AST 35 10/31/2019   ALT 26 10/31/2019   PROT 7.4 10/31/2019   ALBUMIN 4.6 10/31/2019   CALCIUM 10.3 (H) 10/31/2019   Lab Results  Component Value Date   CHOL 194 10/31/2019   Lab Results  Component Value Date   HDL 39 (L) 10/31/2019   Lab Results  Component Value Date   LDLCALC 129 (H) 10/31/2019   Lab Results  Component Value Date   TRIG 142 10/31/2019   Lab Results  Component Value Date   CHOLHDL 5.0 (H) 10/31/2019   Lab Results  Component Value Date   HGBA1C 6.3 (A) 01/26/2020      Assessment & Plan:   Problem List Items Addressed This Visit    None    Visit Diagnoses    Newly diagnosed diabetes ([place])    -  Primary   Relevant Orders   POCT glycosylated hemoglobin (Hb A1C) (Completed)   POCT glucose (manual entry) (Completed)   Microalbumin, urine      No orders of the defined types were placed in this encounter.   Follow-up: No follow-ups on file.   PLAN  Great improvement of a1c down to 6.3  Maintain great changes  Return in 6 mo for check  Patient encouraged to call clinic with any questions, comments, or concerns.  Maximiano Coss, NP

## 2020-04-18 ENCOUNTER — Other Ambulatory Visit: Payer: Self-pay | Admitting: Registered Nurse

## 2020-04-18 DIAGNOSIS — E119 Type 2 diabetes mellitus without complications: Secondary | ICD-10-CM

## 2020-04-18 NOTE — Telephone Encounter (Signed)
Requested Prescriptions  Pending Prescriptions Disp Refills  . metFORMIN (GLUCOPHAGE) 1000 MG tablet [Pharmacy Med Name: METFORMIN HCL 1,000 MG TABLET] 180 tablet 0    Sig: TAKE 1 TABLET (1,000 MG TOTAL) BY MOUTH 2 (TWO) TIMES DAILY WITH A MEAL.     Endocrinology:  Diabetes - Biguanides Passed - 04/18/2020 10:34 AM      Passed - Cr in normal range and within 360 days    Creatinine, Ser  Date Value Ref Range Status  10/31/2019 0.82 0.57 - 1.00 mg/dL Final         Passed - HBA1C is between 0 and 7.9 and within 180 days    Hemoglobin A1C  Date Value Ref Range Status  01/26/2020 6.3 (A) 4.0 - 5.6 % Final         Passed - eGFR in normal range and within 360 days    GFR calc Af Amer  Date Value Ref Range Status  10/31/2019 96 >59 mL/min/1.73 Final    Comment:    **[facility] currently reports eGFR in compliance with the current**   recommendations of the [employer]. [facility] will   update reporting as new guidelines are published from the NKF-ASN   Task force.    GFR calc non Af Amer  Date Value Ref Range Status  10/31/2019 83 >59 mL/min/1.73 Final         Passed - Valid encounter within last 6 months    Recent Outpatient Visits          2 months ago Newly diagnosed diabetes [facility])   [facility] at [place], NP   5 months ago Newly diagnosed diabetes [facility])   [facility] at [facility], Delfino Lovett, NP      Future Appointments            In 3 months Maximiano Coss, NP Primary Care at [place], [facility]

## 2020-05-06 ENCOUNTER — Encounter: Payer: Self-pay | Admitting: *Deleted

## 2020-07-11 ENCOUNTER — Other Ambulatory Visit: Payer: Self-pay | Admitting: Registered Nurse

## 2020-07-11 DIAGNOSIS — E119 Type 2 diabetes mellitus without complications: Secondary | ICD-10-CM

## 2020-07-16 ENCOUNTER — Other Ambulatory Visit: Payer: Self-pay | Admitting: Family Medicine

## 2020-07-26 ENCOUNTER — Ambulatory Visit: Payer: BC Managed Care – PPO | Admitting: Registered Nurse

## 2020-07-30 ENCOUNTER — Ambulatory Visit: Payer: BC Managed Care – PPO | Admitting: Registered Nurse

## 2020-07-30 ENCOUNTER — Telehealth: Payer: Self-pay | Admitting: Registered Nurse

## 2020-07-30 NOTE — Telephone Encounter (Signed)
spoke with pt / pt will c/b to reschedule this appt for 07/30/2020 as provider not available

## 2020-08-06 ENCOUNTER — Encounter: Payer: Self-pay | Admitting: Registered Nurse

## 2020-08-06 ENCOUNTER — Ambulatory Visit: Payer: BC Managed Care – PPO | Admitting: Registered Nurse

## 2020-08-06 ENCOUNTER — Other Ambulatory Visit: Payer: Self-pay

## 2020-08-06 VITALS — BP 107/73 | HR 78 | Temp 98.2°F | Resp 18 | Ht 65.0 in | Wt 164.2 lb

## 2020-08-06 DIAGNOSIS — E785 Hyperlipidemia, unspecified: Secondary | ICD-10-CM | POA: Diagnosis not present

## 2020-08-06 DIAGNOSIS — E1169 Type 2 diabetes mellitus with other specified complication: Secondary | ICD-10-CM | POA: Diagnosis not present

## 2020-08-06 DIAGNOSIS — R7303 Prediabetes: Secondary | ICD-10-CM

## 2020-08-06 LAB — POCT GLYCOSYLATED HEMOGLOBIN (HGB A1C): Hemoglobin A1C: 6 % — AB (ref 4.0–5.6)

## 2020-08-06 MED ORDER — METFORMIN HCL 1000 MG PO TABS: 1000.0000 mg | ORAL_TABLET | Freq: Every day | ORAL | 0 refills | Status: DC

## 2020-08-06 MED ORDER — ATORVASTATIN CALCIUM 20 MG PO TABS: 20.0000 mg | ORAL_TABLET | Freq: Every day | ORAL | 3 refills | Status: DC

## 2020-08-06 NOTE — Progress Notes (Signed)
Established Patient Office Visit  Subjective:  Patient ID: Jacqueline [name], female    DOB: 05/19/1971  Age: 50 y.o. MRN: [id number]  CC:  Chief Complaint  Patient presents with  . Follow-up    Patient states she is here for her 6 month follow up on diabetes. Per patient she has no concerns.    HPI Jacqueline [name] presents for  Last A1c:  Lab Results  Component Value Date   HGBA1C 6.0 (A) 08/06/2020    Currently taking: .med No new complications Reports good compliance with medications Diet has been much improved since initial diagnosis Exercise habits have been steady   Past Medical History:  Diagnosis Date  . Seizure disorder ([place])   . Seizures ([place])     Past Surgical History:  Procedure Laterality Date  . broken nose     in high school    Family History  Problem Relation Age of Onset  . Diabetes Father   . Seizures Brother     Social History   Socioeconomic History  . Marital status: Married    Spouse name: Legrand Como  . Number of children: 2  . Years of education: college  . Highest education level: Not on file  Occupational History  . Occupation: Inventory control    Comment: [employer]  Tobacco Use  . Smoking status: Former Smoker    Packs/day: 0.00    Types: E-cigarettes, Cigarettes    Quit date: 08/22/2016    Years since quitting: 3.9  . Smokeless tobacco: Never Used  . Tobacco comment: quit smoking february 2018  Substance and Sexual Activity  . Alcohol use: No  . Drug use: No  . Sexual activity: Not on file  Other Topics Concern  . Not on file  Social History Narrative   Patient is married Legrand Como) and lives at home with her family.   Patient has 2 children.   Patient works in Pharmacist, hospital for [employer].   Patient has a college education.   Patient drinks 1 cup of caffeine daily.      Social Determinants of Health   Financial Resource Strain: Not on file  Food Insecurity: Not on file  Transportation Needs: Not on file   Physical Activity: Not on file  Stress: Not on file  Social Connections: Not on file  Intimate Partner Violence: Not on file    Outpatient Medications Prior to Visit  Medication Sig Dispense Refill  . ACCU-CHEK GUIDE test strip USE TO TEST BLOOD GLUCOSE UP TO 4 TIMES A DAY 100 strip 2  . Accu-Chek Softclix Lancets lancets USE AS DIRECTED UP TO 4 TIMES A DAY 100 each 1  . blood glucose meter kit and supplies Dispense based on patient and insurance preference. Use up to four times daily as directed. (FOR ICD-10 E10.9, E11.9). 1 each 0  . DEPAKOTE ER 500 MG 24 hr tablet TAKE 2 TABLETS (1,000 MG TOTAL) BY MOUTH DAILY. 180 tablet 3  . Prenatal Vit-Fe Fumarate-FA (PRENATAL VITAMIN PO) Take by mouth. One a day vitamin takes one tablet daily    . topiramate (TOPAMAX) 100 MG tablet Take 1 tablet (100 mg total) by mouth 2 (two) times daily. 180 tablet 3  . atorvastatin (LIPITOR) 20 MG tablet Take 1 tablet (20 mg total) by mouth daily. 90 tablet 3  . metFORMIN (GLUCOPHAGE) 1000 MG tablet TAKE 1 TABLET (1,000 MG TOTAL) BY MOUTH 2 (TWO) TIMES DAILY WITH A MEAL. 180 tablet 0  . LORazepam (ATIVAN) 0.5 MG  tablet Take 1 tablet (0.5 mg total) by mouth daily. (Patient not taking: Reported on 08/06/2020) 7 tablet 0   No facility-administered medications prior to visit.    No Known Allergies  ROS Review of Systems  Constitutional: Negative.   HENT: Negative.   Eyes: Negative.   Respiratory: Negative.   Cardiovascular: Negative.   Gastrointestinal: Negative.   Genitourinary: Negative.   Musculoskeletal: Negative.   Skin: Negative.   Neurological: Negative.   Psychiatric/Behavioral: Negative.   All other systems reviewed and are negative.     Objective:    Physical Exam Vitals and nursing note reviewed.  Constitutional:      General: She is not in acute distress.    Appearance: Normal appearance. She is normal weight. She is not ill-appearing, toxic-appearing or diaphoretic.  Cardiovascular:      Rate and Rhythm: Normal rate and regular rhythm.     Heart sounds: Normal heart sounds. No murmur heard. No friction rub. No gallop.   Pulmonary:     Effort: Pulmonary effort is normal. No respiratory distress.     Breath sounds: Normal breath sounds. No stridor. No wheezing, rhonchi or rales.  Chest:     Chest wall: No tenderness.  Skin:    General: Skin is warm and dry.  Neurological:     General: No focal deficit present.     Mental Status: She is alert and oriented to person, place, and time. Mental status is at baseline.  Psychiatric:        Mood and Affect: Mood normal.        Behavior: Behavior normal.        Thought Content: Thought content normal.        Judgment: Judgment normal.     BP 107/73   Pulse 78   Temp 98.2 F (36.8 C) (Temporal)   Resp 18   Ht '5\' 5"'  (1.651 m)   Wt 164 lb 3.2 oz (74.5 kg)   SpO2 96%   BMI 27.32 kg/m  Wt Readings from Last 3 Encounters:  08/06/20 164 lb 3.2 oz (74.5 kg)  01/26/20 160 lb (72.6 kg)  10/31/19 175 lb (79.4 kg)     Health Maintenance Due  Topic Date Due  . URINE MICROALBUMIN  Never done    There are no preventive care reminders to display for this patient.  Lab Results  Component Value Date   TSH 0.737 10/31/2019   Lab Results  Component Value Date   WBC 11.0 (H) 10/31/2019   HGB 13.8 10/31/2019   HCT 42.4 10/31/2019   MCV 90 10/31/2019   PLT 288 10/31/2019   Lab Results  Component Value Date   NA 136 10/31/2019   K 4.4 10/31/2019   CO2 20 10/31/2019   GLUCOSE 129 (H) 10/31/2019   BUN 8 10/31/2019   CREATININE 0.82 10/31/2019   BILITOT 0.2 10/31/2019   ALKPHOS 97 10/31/2019   AST 35 10/31/2019   ALT 26 10/31/2019   PROT 7.4 10/31/2019   ALBUMIN 4.6 10/31/2019   CALCIUM 10.3 (H) 10/31/2019   Lab Results  Component Value Date   CHOL 194 10/31/2019   Lab Results  Component Value Date   HDL 39 (L) 10/31/2019   Lab Results  Component Value Date   LDLCALC 129 (H) 10/31/2019   Lab Results   Component Value Date   TRIG 142 10/31/2019   Lab Results  Component Value Date   CHOLHDL 5.0 (H) 10/31/2019   Lab Results  Component  Value Date   HGBA1C 6.0 (A) 08/06/2020      Assessment & Plan:   Problem List Items Addressed This Visit   None   Visit Diagnoses    Prediabetes    -  Primary   Relevant Medications   metFORMIN (GLUCOPHAGE) 1000 MG tablet   atorvastatin (LIPITOR) 20 MG tablet   Other Relevant Orders   POCT glycosylated hemoglobin (Hb A1C) (Completed)   Hyperlipidemia associated with type 2 diabetes mellitus ([facility])       Relevant Medications   metFORMIN (GLUCOPHAGE) 1000 MG tablet   atorvastatin (LIPITOR) 20 MG tablet      Meds ordered this encounter  Medications  . metFORMIN (GLUCOPHAGE) 1000 MG tablet    Sig: Take 1 tablet (1,000 mg total) by mouth daily with breakfast.    Dispense:  180 tablet    Refill:  0    Order Specific Question:   Supervising Provider    Answer:   Carlota Raspberry, JEFFREY R [[id number]]  . atorvastatin (LIPITOR) 20 MG tablet    Sig: Take 1 tablet (20 mg total) by mouth daily.    Dispense:  90 tablet    Refill:  3    Order Specific Question:   Supervising Provider    Answer:   Carlota Raspberry, JEFFREY R [[id number]]    Follow-up: No follow-ups on file.   PLAN  Today's a1c at 6.0  Pt doing remarkably well to control t2dm  Reduce metformin to 1082m PO qd  Return q [id number] with concerns  Patient encouraged to call clinic with any questions, comments, or concerns.   RiMaximiano CossNP

## 2020-08-06 NOTE — Patient Instructions (Signed)
° ° ° °  If you have lab work done today you will be contacted with your lab results within the next 2 weeks.  If you have not heard from us then please contact us. The fastest way to get your results is to register for My Chart. ° ° °IF you received an x-ray today, you will receive an invoice from [facility]. Please contact [facility] at [phone] with questions or concerns regarding your invoice.  ° °IF you received labwork today, you will receive an invoice from [facility]. Please contact [facility] at [phone] with questions or concerns regarding your invoice.  ° °Our billing staff will not be able to assist you with questions regarding bills from these companies. ° °You will be contacted with the lab results as soon as they are available. The fastest way to get your results is to activate your My Chart account. Instructions are located on the last page of this paperwork. If you have not heard from us regarding the results in 2 weeks, please contact this office. °  ° ° ° °

## 2020-10-02 ENCOUNTER — Other Ambulatory Visit: Payer: Self-pay | Admitting: Registered Nurse

## 2020-10-02 DIAGNOSIS — R7303 Prediabetes: Secondary | ICD-10-CM

## 2020-10-20 NOTE — Progress Notes (Signed)
[name]: Jacqueline [name] DOB: Feb 22, 1971  REASON FOR VISIT: follow up HISTORY FROM: [name]  Chief Complaint  [name] presents with  . Follow-up    Rm 2  alone Pt is well, has had about 3-4 [name] since last visit.      HISTORY OF PRESENT ILLNESS: 10/21/20 ALL:  She returns for follow up for [name]. She continues to do well on Depakote ER 10109m daily and topiramate 1075mBID. She feels that she is doing very well. She reports that [name] continue to decrease in frequency. She had 3-4 over the past year. She is tolerating medications well. Labs have been stable. She is now taking metformin. A1C dropped from >12 to 6 after starting metformin and changing diet. She has lost 7 pounds. She is feeling great.    10/22/2019 ALL:  Jacqueline [name]. She continues Depakote ER 100041maily and topiramate 100m18mice daily. She reports having a seizure about 1-2 weeks prior to menses 10/14/2019. She thinks last seizure was April 1st. She usually has one event prior to start of menses. She is having irregular menses over the past year. Event usually lasts about 20-30 seconds. She will stare off to the side. She is unresponsive during event. She has some mild fatigue but denies post ictal confusion. No tonic clonic movements. No incontinence or tongue injuries. She does not drive. 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 She is now perimenopausal, last period was 2 weeks ago, spotting. She has a seizure in  May 8th-15 th, and was witnessed. Lasting 20 seconds, no fall to the ground, no tongue bite- just staring off, speech arrest, not convulsive. - its following an aura of dream-like changes of awareness.  Since her [name] were related to menstrual  cycles in the past, she hopes this too will improve in menopause.  She quit smoking earlier this year, but relapsed, she smokes now 4 a day. No alcohol, no HRT or birth control pills.   She  has a history of complex partial [name]. She reports  [name]  during her menstrual cycle these are not every month and less frequent than previously, .  She reports , she will have a "funny sensation" at which point she will sit down. She may have staring spell she denies any loss of consciousness. She is aware of her surroundings.  There has been no evidence of generalized seizure , tongue biting bruises, incontinence etc. She has no fatigue or headache afterwards. She is currently generic Depakote.and  Topamax  due to expense.  So far she has not noticed any difference. She denies any balance issues.    HISTORY 07/06/14 Jacqueline [name]. female. She carries the diagnosis of [name] and has been seizure free for 8 years, [facility].  She has never seen me before, she reported, she was followed by Dr. ReynDoy Mince CaroCecille RubinP.[facility]. [place] right-handed African-American female who was last seen by CaroCecille Rubin11-19-14. She has a history of complex partial [name] that MRIs in the past has been negative. Her spells were announced by a "funny sensation", That she could not  control . The spells followed quickly after, staring and feeling "interrupted. She would miss her turn in a card game and miss parts of conversations. These very brief spells were never leading to a generalized convulsion and she never lost complete awareness of her surroundings they were not followed by any headaches bite marks unexplained bruises or a sudden degree of fatigue. Depakote was added to her medication in February 2009 in October 2090 at her last seizure.  She takes Topamax at 200 mg daily and Depakote at 1000 mg daily.  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 She gained weight over the years on Depakote, over 100 pounds. She reached a plateau    REVIEW OF SYSTEMS: Out of a complete 14 system review of symptoms, the [name] complains only of the following symptoms, [name] and all other reviewed systems are negative.  ALLERGIES: No Known Allergies  HOME MEDICATIONS: Outpatient Medications Prior to Visit  Medication Sig Dispense Refill  . ACCU-CHEK GUIDE test strip USE TO TEST BLOOD GLUCOSE UP TO 4 TIMES A DAY 100 strip 2  . Accu-Chek Softclix Lancets lancets USE AS DIRECTED UP TO 4 TIMES A DAY 100 each 1  . atorvastatin (LIPITOR) 20 MG tablet Take 1 tablet (20 mg total) by mouth daily. 90 tablet 3  . blood glucose meter kit and supplies Dispense based on [name] and insurance preference. Use up to four times daily as directed. (FOR ICD-10 E10.9, E11.9). 1 each 0  . metFORMIN (GLUCOPHAGE) 1000 MG tablet TAKE 1 TABLET (1,000 MG TOTAL) BY MOUTH 2 (TWO) TIMES DAILY WITH A MEAL. 180 tablet 0  . Prenatal Vit-Fe Fumarate-FA (PRENATAL VITAMIN PO) Take by mouth. One a day vitamin takes one tablet daily    . DEPAKOTE ER 500 MG 24 hr tablet TAKE 2 TABLETS (1,000 MG TOTAL) BY MOUTH DAILY. 180 tablet 3  . topiramate (TOPAMAX) 100 MG tablet Take 1 tablet (100 mg total) by mouth 2 (two) times daily. 180 tablet 3   No facility-administered medications prior to visit.    PAST MEDICAL HISTORY: Past Medical History:  Diagnosis Date  . Seizure disorder ([place])   . [name] ([place])     PAST SURGICAL HISTORY: Past Surgical History:  Procedure Laterality Date  . broken nose     in high school    FAMILY HISTORY: Family History  Problem Relation Age of Onset  . Diabetes Father   . [name] Brother     SOCIAL HISTORY: Social History   Socioeconomic History  . Marital status: Married    Spouse name: Legrand Como  . Number of children: 2  . Years of education:  college  . Highest education level: Not on file  Occupational History  . Occupation: Inventory control    Comment: [employer]  Tobacco Use  . Smoking status: Former Smoker    Packs/day: 0.00    Types: E-cigarettes, Cigarettes    Quit date: 08/22/2016    Years since quitting: 4.1  . Smokeless tobacco: Never Used  . Tobacco comment: quit smoking february 2018  Substance and Sexual Activity  . Alcohol use: No  . Drug use: No  . Sexual activity: Not on file  Other Topics Concern  . Not on file  Social History Narrative   [name] is married Legrand Como) and lives at home with her family.   [name] has 2 children.   [name] works in Pharmacist, hospital for [employer].  [name] has a college education.   [name] drinks 1 cup of caffeine daily.      Social Determinants of Health   Financial Resource Strain: Not on file  Food Insecurity: Not on file  Transportation Needs: Not on file  Physical Activity: Not on file  Stress: Not on file  Social Connections: Not on file  Intimate Partner Violence: Not on file      PHYSICAL EXAM  Vitals:   10/21/20 0754  BP: 108/69  Pulse: 92  Weight: 167 lb (75.8 kg)  Height: '5\' 5"'  (1.651 m)   Body mass index is 27.79 kg/m.  Generalized: Well developed, in no acute distress  Cardiology: normal rate and rhythm, no murmur noted Respiratory: clear to auscultation bilaterally  Neurological examination  Mentation: Alert oriented to time, place, history taking. Follows all commands speech and language fluent Cranial nerve II-XII: Pupils were equal round reactive to light. Extraocular movements were full, visual field were full  Motor: The motor testing reveals 5 over 5 strength of all 4 extremities. Very mild action tremor noted of bilateral upper extremities. Good symmetric motor tone is noted throughout.  Gait and station: Gait is normal.   DIAGNOSTIC DATA (LABS, IMAGING, TESTING) - I reviewed [name] records, labs, notes, testing  and imaging myself where available.  No flowsheet data found.   Lab Results  Component Value Date   WBC 11.0 (H) 10/31/2019   HGB 13.8 10/31/2019   HCT 42.4 10/31/2019   MCV 90 10/31/2019   PLT 288 10/31/2019      Component Value Date/Time   NA 136 10/31/2019 1420   K 4.4 10/31/2019 1420   CL 102 10/31/2019 1420   CO2 20 10/31/2019 1420   GLUCOSE 129 (H) 10/31/2019 1420   BUN 8 10/31/2019 1420   CREATININE 0.82 10/31/2019 1420   CALCIUM 10.3 (H) 10/31/2019 1420   PROT 7.4 10/31/2019 1420   ALBUMIN 4.6 10/31/2019 1420   AST 35 10/31/2019 1420   ALT 26 10/31/2019 1420   ALKPHOS 97 10/31/2019 1420   BILITOT 0.2 10/31/2019 1420   GFRNONAA 83 10/31/2019 1420   GFRAA 96 10/31/2019 1420   Lab Results  Component Value Date   CHOL 194 10/31/2019   HDL 39 (L) 10/31/2019   LDLCALC 129 (H) 10/31/2019   TRIG 142 10/31/2019   CHOLHDL 5.0 (H) 10/31/2019   Lab Results  Component Value Date   HGBA1C 6.0 (A) 08/06/2020   No results found for: [id number] Lab Results  Component Value Date   TSH 0.737 10/31/2019     ASSESSMENT AND PLAN 50 y.o. year old female  has a past medical history of Seizure disorder ([place]) and [name] ([place]). here with     ICD-10-CM   1. 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 She continues Depakote ER 1053m daily and topiramate 1075mtwice daily. No adverse effects. Labs have been stable. She requests to get upcoming labs with PCP. May return here for valproic acid level if needed. She will follow up in 1 year, sooner if needed. She will call with any new or worsening concerns. She verbalizes understanding and agreement with this plan.    No orders of the defined types were placed in this encounter.    Meds ordered this encounter  Medications  . topiramate (TOPAMAX) 100 MG tablet    Sig: Take 1 tablet (100 mg total) by mouth 2 (  two) times  daily.    Dispense:  180 tablet    Refill:  3    Order Specific Question:   Supervising Provider    Answer:   Melvenia Beam [id number]  . DEPAKOTE ER 500 MG 24 hr tablet    Sig: Take 2 tablets (1,000 mg total) by mouth daily.    Dispense:  180 tablet    Refill:  3    Order Specific Question:   Supervising Provider    Answer:   Melvenia Beam [id number]      I spent 20 minutes with the [name]. 50% of this time was spent counseling and educating [name] on plan of care and medications.    Debbora Presto, FNP-C 10/21/2020, 8:27 AM [facility] [address], [place], [place] [address] [phone]

## 2020-10-20 NOTE — Patient Instructions (Signed)
Below is our plan:  We will continue Depakote 1000mg  daily and topiramate 100mg  twice daily.   Please make sure you are staying well hydrated. I recommend 50-60 ounces daily. Well balanced diet and regular exercise encouraged. Consistent sleep schedule with 6-8 hours recommended.   Please continue follow up with care team as directed.   Follow up with me in 1 year   You may receive a survey regarding today's visit. I encourage you to leave honest feed back as I do use this information to improve patient care. Thank you for seeing me today!      Seizure, Adult A seizure is a sudden burst of abnormal electrical and chemical activity in the brain. Seizures usually last from 30 seconds to 2 minutes.  What are the causes? Common causes of this condition include:  Fever or infection.  Problems that affect the brain. These may include: ? A brain or head injury. ? Bleeding in the brain. ? A brain tumor.  Low levels of blood sugar or salt.  Kidney problems or liver problems.  Conditions that are passed from parent to child (are inherited).  Problems with a substance, such as: ? Having a reaction to a drug or a medicine. ? Stopping the use of a substance all of a sudden (withdrawal).  A stroke.  Disorders that affect how you develop. Sometimes, the cause may not be known.  What increases the risk?  Having someone in your family who has epilepsy. In this condition, seizures happen again and again over time. They have no clear cause.  Having had a tonic-clonic seizure before. This type of seizure causes you to: ? Tighten the muscles of the whole body. ? Lose consciousness.  Having had a head injury or strokes before.  Having had a lack of oxygen at birth. What are the signs or symptoms? There are many types of seizures. The symptoms vary depending on the type of seizure you have. Symptoms during a seizure  Shaking that you cannot control (convulsions) with fast, jerky  movements of muscles.  Stiffness of the body.  Breathing problems.  Feeling mixed up (confused).  Staring or not responding to sound or touch.  Head nodding.  Eyes that blink, flutter, or move fast.  Drooling, grunting, or making clicking sounds with your mouth  Losing control of when you pee or poop. Symptoms before a seizure  Feeling afraid, nervous, or worried.  Feeling like you may vomit.  Feeling like: ? You are moving when you are not. ? Things around you are moving when they are not.  Feeling like you saw or heard something before (dj vu).  Odd tastes or smells.  Changes in how you see. You may see flashing lights or spots. Symptoms after a seizure  Feeling confused.  Feeling sleepy.  Headache.  Sore muscles. How is this treated? If your seizure stops on its own, you will not need treatment. If your seizure lasts longer than 5 minutes, you will normally need treatment. Treatment may include:  Medicines given through an IV tube.  Avoiding things, such as medicines, that are known to cause your seizures.  Medicines to prevent seizures.  A device to prevent or control seizures.  Surgery.  A diet low in carbohydrates and high in fat (ketogenic diet). Follow these instructions at home: Medicines  Take over-the-counter and prescription medicines only as told by your doctor.  Avoid foods or drinks that may keep your medicine from working, such as alcohol. Activity  Follow instructions about driving, swimming, or doing things that would be dangerous if you had another seizure. Wait until your doctor says it is safe for you to do these things.  If you live in the [place] ask your local department of motor vehicles when you can drive.  Get a lot of rest. Teaching others  Teach friends and family what to do when you have a seizure. They should: ? Help you get down to the ground. ? Protect your head and body. ? Loosen any clothing around your  neck. ? Turn you on your side. ? Know whether or not you need emergency care. ? Stay with you until you are better.  Also, tell them what not to do if you have a seizure. Tell them: ? They should not hold you down. ? They should not put anything in your mouth.   General instructions  Avoid anything that gives you seizures.  Keep a seizure diary. Write down: ? What you remember about each seizure. ? What you think caused each seizure.  Keep all follow-up visits. Contact a doctor if:  You have another seizure or seizures. Call the doctor each time you have a seizure.  The pattern of your seizures changes.  You keep having seizures with treatment.  You have symptoms of being sick or having an infection.  You are not able to take your medicine. Get help right away if:  You have any of these problems: ? A seizure that lasts longer than 5 minutes. ? Many seizures in a row and you do not feel better between seizures. ? A seizure that makes it harder to breathe. ? A seizure and you can no longer speak or use part of your body.  You do not wake up right after a seizure.  You get hurt during a seizure.  You feel confused or have pain right after a seizure. These symptoms may be an emergency. Get help right away. Call your local emergency services (911 in the [place]  Do not wait to see if the symptoms will go away.  Do not drive yourself to the hospital. Summary  A seizure is a sudden burst of abnormal electrical and chemical activity in the brain. Seizures normally last from 30 seconds to 2 minutes.  Causes of seizures include illness, injury to the head, low levels of blood sugar or salt, and certain conditions.  Most seizures will stop on their own in less than 5 minutes. Seizures that last longer than 5 minutes are a medical emergency and need treatment right away.  Many medicines are used to treat seizures. Take over-the-counter and prescription medicines only as told  by your doctor. This information is not intended to replace advice given to you by your health care provider. Make sure you discuss any questions you have with your health care provider. Document Revised: 12/26/2019 Document Reviewed: 12/26/2019 Elsevier Patient Education  [place].

## 2020-10-21 ENCOUNTER — Encounter: Payer: Self-pay | Admitting: Family Medicine

## 2020-10-21 ENCOUNTER — Ambulatory Visit: Payer: BC Managed Care – PPO | Admitting: Family Medicine

## 2020-10-21 VITALS — BP 108/69 | HR 92 | Ht 65.0 in | Wt 167.0 lb

## 2020-10-21 DIAGNOSIS — G40209 Localization-related (focal) (partial) symptomatic epilepsy and epileptic syndromes with complex partial seizures, not intractable, without status epilepticus: Secondary | ICD-10-CM

## 2020-10-21 MED ORDER — TOPIRAMATE 100 MG PO TABS: 100.0000 mg | ORAL_TABLET | Freq: Two times a day (BID) | ORAL | 3 refills | Status: DC

## 2020-10-21 MED ORDER — DEPAKOTE ER 500 MG PO TB24: 1000.0000 mg | ORAL_TABLET | Freq: Every day | ORAL | 3 refills | Status: DC

## 2020-10-25 ENCOUNTER — Telehealth: Payer: Self-pay | Admitting: Family Medicine

## 2020-10-25 MED ORDER — DEPAKOTE ER 500 MG PO TB24: 1000.0000 mg | ORAL_TABLET | Freq: Every day | ORAL | 3 refills | Status: DC

## 2020-10-25 NOTE — Telephone Encounter (Signed)
Please prep the prescription for me to sign . TY

## 2020-10-25 NOTE — Telephone Encounter (Signed)
Pt called wanting an update on the forms. She states she has been 4 days without her medication and she does not want to go another day without it. Please advise.

## 2020-10-25 NOTE — Telephone Encounter (Signed)
I called pt, [facility] needing [facility] signed for Brand Name Depakote.  She has been on since feb 2009. Stable, no seizures.  Tried generic, had break thru sz why on Brand Name. Amy NP is out of office,

## 2020-10-25 NOTE — Telephone Encounter (Signed)
Done to [facility].

## 2020-10-25 NOTE — Telephone Encounter (Signed)
Pt called stating that she is needing a DAW form filled out and sent to the [facility] due to having to take the brand name for her DEPAKOTE ER 500 MG 24 hr tablet. Please advise.

## 2020-10-26 MED ORDER — DEPAKOTE ER 500 MG PO TB24: 1000.0000 mg | ORAL_TABLET | Freq: Every day | ORAL | 3 refills | Status: DC

## 2020-10-26 NOTE — Telephone Encounter (Signed)
Pt has called back stating she is being told generic for Depakote will be filled since DAW form has not been received yet, pt is asking for a call back from [facility]

## 2020-10-26 NOTE — Telephone Encounter (Signed)
I called pt and relayed that prescription done by Dr. Frances Furbish, at 1227, this was done previously but needed [facility] Brand Penalty Exception request this was done by Dr. Vickey Huger faxed with fax confirmation received. Relayed this to pt.

## 2020-10-26 NOTE — Telephone Encounter (Signed)
Rx for Depakote DAW signed.

## 2020-10-26 NOTE — Addendum Note (Signed)
Addended by: Huston Foley on: 10/26/2020 12:28 PM   Modules accepted: Orders

## 2020-10-27 NOTE — Telephone Encounter (Signed)
[facility] Approval for Brand Pen[facility] Exception  PA# [employer] [id number] [facility]. 10-26-20 thru 10-27-2023.

## 2021-01-07 ENCOUNTER — Telehealth: Payer: Self-pay | Admitting: Family Medicine

## 2021-01-07 NOTE — Telephone Encounter (Signed)
[facility] called, would it be ok to dispense generic brand for DEPAKOTE ER 500 MG 24 hr tablet. Brand name is $200, generic $20.   Contact info: [phone] Order no: [id number]

## 2021-01-10 MED ORDER — DIVALPROEX SODIUM ER 500 MG PO TB24: 1000.0000 mg | ORAL_TABLET | Freq: Every day | ORAL | 3 refills | Status: DC

## 2021-01-10 NOTE — Telephone Encounter (Signed)
Given the cost of the medication Dr Vickey Huger states that she would be ok with the generic medication given she has another medication topamax on board. Advised that I would send the generic script to the pharmacy for the patient. Advised if she has any problems with switching to the generic then she should let us know and then we can attempt a PA stating she tried generic. Pt verbalized understanding and had no other questions at this time.

## 2021-01-10 NOTE — Telephone Encounter (Signed)
Called pt. She has never been on generic Depakote. She has had 1 seizure on brand name Depakote since last appt w/ AL,NP in April. She would like to know what AL,NP would recommend. Unsure if she should change to generic. Advised we will discuss w/ her and call back. Aware AL,NP out today, will get back with her most likely tomorrow.

## 2021-01-17 ENCOUNTER — Other Ambulatory Visit: Payer: Self-pay | Admitting: Registered Nurse

## 2021-01-17 DIAGNOSIS — R7303 Prediabetes: Secondary | ICD-10-CM

## 2021-01-31 ENCOUNTER — Ambulatory Visit: Payer: Self-pay | Admitting: Registered Nurse

## 2021-02-17 ENCOUNTER — Encounter: Payer: Self-pay | Admitting: Registered Nurse

## 2021-02-17 ENCOUNTER — Other Ambulatory Visit: Payer: Self-pay

## 2021-02-17 ENCOUNTER — Ambulatory Visit: Payer: BC Managed Care – PPO | Admitting: Registered Nurse

## 2021-02-17 VITALS — BP 112/60 | HR 77 | Temp 98.1°F | Resp 16 | Ht 65.0 in | Wt 169.6 lb

## 2021-02-17 DIAGNOSIS — E1169 Type 2 diabetes mellitus with other specified complication: Secondary | ICD-10-CM | POA: Diagnosis not present

## 2021-02-17 DIAGNOSIS — Z1231 Encounter for screening mammogram for malignant neoplasm of breast: Secondary | ICD-10-CM | POA: Diagnosis not present

## 2021-02-17 DIAGNOSIS — R7303 Prediabetes: Secondary | ICD-10-CM

## 2021-02-17 DIAGNOSIS — E119 Type 2 diabetes mellitus without complications: Secondary | ICD-10-CM | POA: Diagnosis not present

## 2021-02-17 DIAGNOSIS — E785 Hyperlipidemia, unspecified: Secondary | ICD-10-CM

## 2021-02-17 DIAGNOSIS — Z1211 Encounter for screening for malignant neoplasm of colon: Secondary | ICD-10-CM

## 2021-02-17 LAB — POCT GLYCOSYLATED HEMOGLOBIN (HGB A1C): Hemoglobin A1C: 6.1 % — AB (ref 4.0–5.6)

## 2021-02-17 MED ORDER — ATORVASTATIN CALCIUM 20 MG PO TABS: 20.0000 mg | ORAL_TABLET | Freq: Every day | ORAL | 3 refills | Status: AC

## 2021-02-17 MED ORDER — METFORMIN HCL 1000 MG PO TABS: 500.0000 mg | ORAL_TABLET | Freq: Every day | ORAL | 1 refills | Status: DC

## 2021-02-17 NOTE — Progress Notes (Signed)
Established Patient Office Visit  Subjective:  Patient ID: Jacqueline [name], female    DOB: 09-20-1970  Age: 50 y.o. MRN: [id number]  CC:  Chief Complaint  Patient presents with   Prediabetes    Check A1C    HPI Jacqueline [name] presents for t2dm follow up  Last A1c:  Lab Results  Component Value Date   HGBA1C 6.1 (A) 02/17/2021    Currently taking: metformin 1041m po qd ac, lipitor 266mpo qd No new complications Reports good compliance with medications Diet has been steady, improved since initial dx Exercise habits have been steady  Due for mammography - will order Due for colon ca screen - referral to be placed.    Past Medical History:  Diagnosis Date   [place] disorder ([facility]   [place]    Past Surgical History:  Procedure Laterality Date   broken nose     in high school    Family History  Problem Relation Age of Onset   Diabetes Father    Seizures Brother     Social History   Socioeconomic History   Marital status: Married    Spouse name: MiLegrand Como Number of children: 2   Years of education: college   Highest education level: Not on file  Occupational History   Occupation: Inventory control    Comment: [employer]  Tobacco Use   Smoking status: Former    Packs/day: 0.00    Types: E-cigarettes, Cigarettes    Quit date: 08/22/2016    Years since quitting: 4.4   Smokeless tobacco: Never   Tobacco comments:    quit smoking february 2018  Substance and Sexual Activity   Alcohol use: No   Drug use: No   Sexual activity: Not on file  Other Topics Concern   Not on file  Social History Narrative   Patient is married (MLegrand Comoand lives at home with her family.   Patient has 2 children.   Patient works in inPharmacist, hospitalor [employer]  Patient has a college education.   Patient drinks 1 cup of caffeine daily.      Social Determinants of Health   Financial Resource Strain: Not on file  Food Insecurity: Not on file   Transportation Needs: Not on file  Physical Activity: Not on file  Stress: Not on file  Social Connections: Not on file  Intimate Partner Violence: Not on file    Outpatient Medications Prior to Visit  Medication Sig Dispense Refill   ACCU-CHEK GUIDE test strip USE TO TEST BLOOD GLUCOSE UP TO 4 TIMES A DAY 100 strip 2   Accu-Chek Softclix Lancets lancets USE AS DIRECTED UP TO 4 TIMES A DAY 100 each 1   blood glucose meter kit and supplies Dispense based on patient and insurance preference. Use up to four times daily as directed. (FOR ICD-10 E10.9, E11.9). 1 each 0   divalproex (DEPAKOTE ER) 500 MG 24 hr tablet Take 2 tablets (1,000 mg total) by mouth daily. 180 tablet 3   Prenatal Vit-Fe Fumarate-FA (PRENATAL VITAMIN PO) Take by mouth. One a day vitamin takes one tablet daily     topiramate (TOPAMAX) 100 MG tablet Take 1 tablet (100 mg total) by mouth 2 (two) times daily. 180 tablet 3   atorvastatin (LIPITOR) 20 MG tablet Take 1 tablet (20 mg total) by mouth daily. 90 tablet 3   metFORMIN (GLUCOPHAGE) 1000 MG tablet TAKE 1 TABLET (1,000 MG TOTAL) BY MOUTH 2 (TWO)  TIMES DAILY WITH A MEAL. 180 tablet 0   No facility-administered medications prior to visit.    No Known Allergies  ROS Review of Systems  Constitutional: Negative.   HENT: Negative.    Eyes: Negative.   Respiratory: Negative.    Cardiovascular: Negative.   Gastrointestinal: Negative.   Genitourinary: Negative.   Musculoskeletal: Negative.   Skin: Negative.   Neurological: Negative.   Psychiatric/Behavioral: Negative.    All other systems reviewed and are negative.    Objective:    Physical Exam Vitals and nursing note reviewed.  Constitutional:      General: She is not in acute distress.    Appearance: Normal appearance. She is normal weight. She is not ill-appearing, toxic-appearing or diaphoretic.  Cardiovascular:     Rate and Rhythm: Normal rate and regular rhythm.     Heart sounds: Normal heart sounds.  No murmur heard.   No friction rub. No gallop.  Pulmonary:     Effort: Pulmonary effort is normal. No respiratory distress.     Breath sounds: Normal breath sounds. No stridor. No wheezing, rhonchi or rales.  Chest:     Chest wall: No tenderness.  Skin:    General: Skin is warm and dry.  Neurological:     General: No focal deficit present.     Mental Status: She is alert and oriented to person, place, and time. Mental status is at baseline.  Psychiatric:        Mood and Affect: Mood normal.        Behavior: Behavior normal.        Thought Content: Thought content normal.        Judgment: Judgment normal.    BP 112/60   Pulse 77   Temp 98.1 F (36.7 C) (Temporal)   Resp 16   Ht 5' 5" (1.651 m)   Wt 169 lb 9.6 oz (76.9 kg)   SpO2 98%   BMI 28.22 kg/m  Wt Readings from Last 3 Encounters:  02/17/21 169 lb 9.6 oz (76.9 kg)  10/21/20 167 lb (75.8 kg)  08/06/20 164 lb 3.2 oz (74.5 kg)     Health Maintenance Due  Topic Date Due   PNEUMOCOCCAL POLYSACCHARIDE VACCINE AGE 44-64 HIGH RISK  Never done   FOOT EXAM  Never done   OPHTHALMOLOGY EXAM  Never done   URINE MICROALBUMIN  Never done   Hepatitis C Screening  Never done   PAP SMEAR-Modifier  Never done   COLONOSCOPY (Pts 45-57yr Insurance coverage will need to be confirmed)  Never done   MAMMOGRAM  Never done   INFLUENZA VACCINE  01/31/2021    There are no preventive care reminders to display for this patient.  Lab Results  Component Value Date   TSH 0.737 10/31/2019   Lab Results  Component Value Date   WBC 11.0 (H) 10/31/2019   HGB 13.8 10/31/2019   HCT 42.4 10/31/2019   MCV 90 10/31/2019   PLT 288 10/31/2019   Lab Results  Component Value Date   NA 136 10/31/2019   K 4.4 10/31/2019   CO2 20 10/31/2019   GLUCOSE 129 (H) 10/31/2019   BUN 8 10/31/2019   CREATININE 0.82 10/31/2019   BILITOT 0.2 10/31/2019   ALKPHOS 97 10/31/2019   AST 35 10/31/2019   ALT 26 10/31/2019   PROT 7.4 10/31/2019   ALBUMIN  4.6 10/31/2019   CALCIUM 10.3 (H) 10/31/2019   Lab Results  Component Value Date   CHOL 194 10/31/2019  Lab Results  Component Value Date   HDL 39 (L) 10/31/2019   Lab Results  Component Value Date   LDLCALC 129 (H) 10/31/2019   Lab Results  Component Value Date   TRIG 142 10/31/2019   Lab Results  Component Value Date   CHOLHDL 5.0 (H) 10/31/2019   Lab Results  Component Value Date   HGBA1C 6.1 (A) 02/17/2021      Assessment & Plan:   Problem List Items Addressed This Visit       Endocrine   Well controlled type 2 diabetes mellitus ([place])   Relevant Medications   atorvastatin (LIPITOR) 20 MG tablet   metFORMIN (GLUCOPHAGE) 1000 MG tablet   Other Visit Diagnoses     Type 2 diabetes mellitus without complication, without long-term current use of insulin ([facility])    -  Primary   Relevant Medications   atorvastatin (LIPITOR) 20 MG tablet   metFORMIN (GLUCOPHAGE) 1000 MG tablet   Other Relevant Orders   POCT glycosylated hemoglobin (Hb A1C) (Completed)   Screening mammogram for breast cancer       Relevant Orders   MM Digital Screening   Colon cancer screening       Relevant Orders   Ambulatory referral to Gastroenterology   Hyperlipidemia associated with type 2 diabetes mellitus ([place])       Relevant Medications   atorvastatin (LIPITOR) 20 MG tablet   metFORMIN (GLUCOPHAGE) 1000 MG tablet   Prediabetes       Relevant Medications   atorvastatin (LIPITOR) 20 MG tablet   metFORMIN (GLUCOPHAGE) 1000 MG tablet       Meds ordered this encounter  Medications   atorvastatin (LIPITOR) 20 MG tablet    Sig: Take 1 tablet (20 mg total) by mouth daily.    Dispense:  90 tablet    Refill:  3    Order Specific Question:   Supervising Provider    Answer:   Carlota Raspberry, JEFFREY R [[id number]]   metFORMIN (GLUCOPHAGE) 1000 MG tablet    Sig: Take 0.5 tablets (500 mg total) by mouth daily with breakfast.    Dispense:  90 tablet    Refill:  1    Order Specific Question:    Supervising Provider    Answer:   Carlota Raspberry, JEFFREY R [[id number]]     Follow-up: Return in about 6 months (around 08/20/2021) for CPE and labs.   PLAN A1c today at 6.1 - essentially steady from previous. Can reduce metformin to 5108m PO daily. Continue glucose monitoring. If elevated, increase metformin back to 10055mdaily. Return in 6 mo for CPE and labs Patient encouraged to call clinic with any questions, comments, or concerns.   RiMaximiano CossNP

## 2021-02-17 NOTE — Patient Instructions (Addendum)
Ms. Buskirk -   Randie Heinz work on this. I continue to be in awe of the turn around you have had after initial diagnosis.  Can reduce metformin to 500mg  daily. Continue atorvastatin.  See you in 6 mo - let's do a full physical with labs at that time.  Call sooner if you need anything  Thank you  Rich

## 2021-08-05 ENCOUNTER — Telehealth: Payer: Self-pay | Admitting: Family Medicine

## 2021-08-05 ENCOUNTER — Encounter: Payer: Self-pay | Admitting: Neurology

## 2021-08-05 NOTE — Telephone Encounter (Signed)
Pt states [facility](ph [phone]) needs a letter from her Dr stating it is medically necessary that for [id number] ER 500 MG 24 hr tablet she only gets that in brand name version only, please call.

## 2021-08-08 NOTE — Telephone Encounter (Signed)
PA completed through [facility] for the brand name medication.  KEY:[id number] Approved immediately  [id number];Review Type:Prior Auth;Coverage Start Date:07/09/2021;Coverage End Date:08/08/2022;

## 2021-08-15 ENCOUNTER — Other Ambulatory Visit: Payer: Self-pay

## 2021-08-15 MED ORDER — DIVALPROEX SODIUM ER 500 MG PO TB24: 1000.0000 mg | ORAL_TABLET | Freq: Every day | ORAL | 0 refills | Status: DC

## 2021-08-23 ENCOUNTER — Encounter: Payer: Self-pay | Admitting: Registered Nurse

## 2021-10-17 ENCOUNTER — Telehealth: Payer: Self-pay | Admitting: Family Medicine

## 2021-10-17 MED ORDER — TOPIRAMATE 100 MG PO TABS: 100.0000 mg | ORAL_TABLET | Freq: Two times a day (BID) | ORAL | 0 refills | Status: DC

## 2021-10-17 NOTE — Telephone Encounter (Signed)
Pt request refill fortopiramate (TOPAMAX) 100 MG tablet at [id number]. ? ?Would like a call back from the nurse to confirm refill has been sent. ?

## 2021-10-17 NOTE — Telephone Encounter (Signed)
Called pt. Advised we sent refill as requested to pharmacy. She verbalized understanding. She has yearly f/u scheduled for 10/26/21.  ?

## 2021-10-25 NOTE — Patient Instructions (Signed)
Below is our plan: ? ?We will switch back to Depakote ER brand continuing 1000mg  daily. Continue topiramate 100mg  twice daily. I will update labs, today. We are going to get an updated EEG. We may need to do an ambulatory EEG in the future. We will adjust dose of seizure medications as needed pending lab and EEG workup.  ? ?Please make sure you are consistent with timing of seizure medication. I recommend annual visit with primary care provider (PCP) for complete physical and routine blood work. We will monitor vitamin D level. I recommend daily intake of vitamin D (400-800iu) and calcium (800-1000mg ) for bone health. Discuss Dexa screening with PCP.  ? ?According to [place] law, you can not drive unless you are seizure / syncope free for at least 6 months and under physician's care. ? ?Please maintain precautions. Do not participate in activities where a loss of awareness could harm you or someone else. No swimming alone, no tub bathing, no hot tubs, no driving, no operating motorized vehicles (cars, ATVs, motocycles, etc), lawnmowers, power tools or firearms. No standing at heights, such as rooftops, ladders or stairs. Avoid hot objects such as stoves, heaters, open fires. Wear a helmet when riding a bicycle, scooter, skateboard, etc. and avoid areas of traffic. Set your water heater to 120 degrees or less.  ? ?Please make sure you are staying well hydrated. I recommend 50-60 ounces daily. Well balanced diet and regular exercise encouraged. Consistent sleep schedule with 6-8 hours recommended.  ? ?Please continue follow up with care team as directed.  ? ?Follow up with Dr Brett Fairy in 4 months  ? ?You may receive a survey regarding today's visit. I encourage you to leave honest feed back as I do use this information to improve patient care. Thank you for seeing me today!  ? ? ?

## 2021-10-25 NOTE — Progress Notes (Signed)
? ? ?PATIENT: Jacqueline [name] ?DOB: 11-Dec-1970 ? ?REASON FOR VISIT: follow up ?HISTORY FROM: patient ? ?Chief Complaint  ?Patient presents with  ? Follow-up  ?  Rm 1, w husband. Here for yearly sz f/u. Pt reports multiple sz since last ov. Most recently had one this Sunday and yesterday. Pt believes she had a spike in her blood sugar. No missed doses. Pt reports having cut her Metformin in half before her sz.   ?  ? ?HISTORY OF PRESENT ILLNESS: ? ?10/26/21 ALL:  ?Jacqueline [name] returns for follow up for seizures. She continues brand Depakote ER 1035m daily and topiramate 1025mBID. She reports tolerating both medications well with no obvious adverse effects.  ? ?She reports multiple seizures over the last year. Her husband is with her, today and aids in history. He has a video capturing an event 02/25/2021. She becomes unresponsive, eyes veer to right and she is unable to use right hand. No tonic clonic movements. Events last about 30-60 seconds. She seems confused for a minute or so following seizure then back to baseline.   ? ?She called 12/2020 reporting expense with brand Depakote. Per Dr DoBrett Fairyok to use generic as she was taking topiramate as well. Mr and Mrs WoHolquinoth feel that seizure frequency has increased following change to generic divalproex. Mr WoEssmanstimates that she has had at least 10 seizure events over the past three months.  ? ?No significant changes in sleep patterns or hydration status. She has decreased dose of metformin from 50055mID to QD. A1C has been well managed. She has focused on healthy well balanced meals. She endorses more stress since switching jobs in 04/2021. 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 She is due for CPE with PCP.  ? ?10/21/2020 ALL:  ?She returns for follow up for seizures. She continues to do well on Depakote ER 1000m14mily and topiramate 100mg61m. She feels that she is doing very well. She reports that seizures continue to decrease in frequency. She had 3-4 over  the past year. She is tolerating medications well. Labs have been stable. She is now taking metformin. A1C dropped from >12 to 6 after starting metformin and changing diet. She has lost 7 pounds. She is feeling great.  ? ? ?10/22/2019 ALL:  ?[name] 51 y.79 female here today for follow up for seizures. She continues Depakote ER 1000mg 74my and topiramate 100mg t31m daily. She reports having a seizure about 1-2 weeks prior to menses 10/14/2019. She thinks last seizure was April 1st. She usually has one event prior to start of menses. She is having irregular menses over the past year. Event usually lasts about 20-30 seconds. She will stare off to the side. She is unresponsive during event. She has some mild fatigue but denies post ictal confusion. No tonic clonic movements. No incontinence or tongue injuries. She does not drive. 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 ?Mrs. Jacqueline Marcea Rojekeanwhile 48 year38ld [place] followed for infrequent seizures. She is now perimenopausal, last period was 2 weeks ago, spotting. ?She has a seizure in  May 8th-15 th, and was witnessed. Lasting 20 seconds, no fall to the ground, no tongue bite- just staring off, speech arrest, not convulsive. - its following an aura of dream-like changes of awareness.  ?Since her seizures were related to menstrual cycles  in the past, she hopes this too will improve in menopause.  ?She quit smoking earlier this year, but relapsed, she smokes now 4 a day. No alcohol, no HRT or birth control pills.  ? She  has a history of complex partial seizures. She reports  seizures  during her menstrual cycle these are not every month and less frequent than previously, .  She reports , she will have a "funny sensation" at which point she will sit down. She may have staring spell she denies any loss of consciousness. She is aware of her  surroundings.  There has been no evidence of generalized seizure , tongue biting bruises, incontinence etc. She has no fatigue or headache afterwards. She is currently generic Depakote.and  Topamax  due to expense.  So far she has not noticed any difference. She denies any balance issues.  ?  ?  ?HISTORY 07/06/14 CD ?Jacqueline [name] is a 51 y.o. female. She carries the diagnosis of seizures and has been seizure free for 8 years, since being treated at [facility] .  She had seizure related to her menstrual cycle.   ?She has never seen me before, she reported, she was followed by Dr. [name] and Cecille Rubin, [facility].   ?Jacqueline [name] is a right-handed African-American female who was last seen by Cecille Rubin on 05-21-13. She has a history of complex partial seizures that MRIs in the past has been negative. Her spells were announced by a "funny sensation", ?That she could not control . The spells followed quickly after, staring and feeling "interrupted. She would miss her turn in a card game and miss parts of conversations. ?These very brief spells were never leading to a generalized convulsion and she never lost complete awareness of her surroundings they were not followed by any headaches bite marks unexplained bruises or a sudden degree of fatigue. Depakote was added to her medication in February 2009 in October 2090 at her last seizure.    ?She takes Topamax at 200 mg daily and Depakote at 1000 mg daily.   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 She gained weight over the years on Depakote, over 100 pounds. She reached a plateau  ? ? ?REVIEW OF SYSTEMS: Out of a complete 14 system review of symptoms, the patient complains only of the following symptoms, seizures and all other reviewed systems are negative. ? ?ALLERGIES: ?No Known Allergies ? ?HOME MEDICATIONS: ?Outpatient Medications Prior to Visit  ?Medication Sig Dispense Refill  ? ACCU-CHEK GUIDE test strip USE  TO TEST BLOOD GLUCOSE UP TO 4 TIMES A DAY 100 strip 2  ? Accu-Chek Softclix Lancets lancets USE AS DIRECTED UP TO 4 TIMES A DAY 100 each 1  ? atorvastatin (LIPITOR) 20 MG tablet Take 1 tablet (20 mg total) by mouth daily. 90 tablet 3  ? blood glucose meter kit and supplies Dispense based on patient and insurance preference. Use up to four times daily as directed. (FOR ICD-10 E10.9, E11.9). 1 each 0  ? Cholecalciferol (VITAMIN D3 GUMMIES ADULT) 25 MCG (1000 UT) CHEW Chew by mouth.    ? metFORMIN (GLUCOPHAGE) 1000 MG tablet Take 0.5 tablets (500 mg total) by mouth daily with breakfast. 90 tablet 1  ? Prenatal Vit-Fe Fumarate-FA (PRENATAL VITAMIN PO) Take by mouth. One a day vitamin takes one tablet daily    ? divalproex (DEPAKOTE ER) 500 MG 24 hr tablet Take 2 tablets (1,000 mg total) by  mouth daily. 180 tablet 0  ? topiramate (TOPAMAX) 100 MG tablet Take 1 tablet (100 mg total) by mouth 2 (two) times daily. 180 tablet 0  ? ?No facility-administered medications prior to visit.  ? ? ?PAST MEDICAL HISTORY: ?Past Medical History:  ?Diagnosis Date  ? Seizure disorder ([place])   ? Seizures ([place])   ? ? ?PAST SURGICAL HISTORY: ?Past Surgical History:  ?Procedure Laterality Date  ? broken nose    ? in high school  ? ? ?FAMILY HISTORY: ?Family History  ?Problem Relation Age of Onset  ? Diabetes Father   ? Seizures Brother   ? ? ?SOCIAL HISTORY: ?Social History  ? ?Socioeconomic History  ? Marital status: Married  ?  Spouse name: Legrand Como  ? Number of children: 2  ? Years of education: college  ? Highest education level: Not on file  ?Occupational History  ? Occupation: Inventory control  ?  Comment: [employer]  ?Tobacco Use  ? Smoking status: Former  ?  Packs/day: 0.00  ?  Types: E-cigarettes, Cigarettes  ?  Quit date: 08/22/2016  ?  Years since quitting: 5.1  ? Smokeless tobacco: Never  ? Tobacco comments:  ?  quit smoking february 2018  ?Substance and Sexual Activity  ? Alcohol use: No  ? Drug use: No  ? Sexual activity: Not  on file  ?Other Topics Concern  ? Not on file  ?Social History Narrative  ? Patient is married Legrand Como) and lives at home with her family.  ? Patient has 2 children.  ? Patient works in Geophysical data processor c

## 2021-10-26 ENCOUNTER — Encounter: Payer: Self-pay | Admitting: Family Medicine

## 2021-10-26 ENCOUNTER — Ambulatory Visit: Payer: Managed Care, Other (non HMO) | Admitting: Family Medicine

## 2021-10-26 VITALS — BP 121/78 | HR 94 | Ht 65.0 in | Wt 175.5 lb

## 2021-10-26 DIAGNOSIS — G40209 Localization-related (focal) (partial) symptomatic epilepsy and epileptic syndromes with complex partial seizures, not intractable, without status epilepticus: Secondary | ICD-10-CM

## 2021-10-26 MED ORDER — TOPIRAMATE 100 MG PO TABS: 100.0000 mg | ORAL_TABLET | Freq: Two times a day (BID) | ORAL | 3 refills | Status: DC

## 2021-10-26 MED ORDER — DEPAKOTE ER 500 MG PO TB24: 1000.0000 mg | ORAL_TABLET | Freq: Every day | ORAL | 3 refills | Status: DC

## 2021-10-27 LAB — COMPREHENSIVE METABOLIC PANEL
ALT: 27 IU/L (ref 0–32)
AST: 30 IU/L (ref 0–40)
Albumin/Globulin Ratio: 1.5 (ref 1.2–2.2)
Albumin: 4.6 g/dL (ref 3.8–4.9)
Alkaline Phosphatase: 85 IU/L (ref 44–121)
BUN/Creatinine Ratio: 10 (ref 9–23)
BUN: 9 mg/dL (ref 6–24)
Bilirubin Total: 0.3 mg/dL (ref 0.0–1.2)
CO2: 20 mmol/L (ref 20–29)
Calcium: 10.3 mg/dL — ABNORMAL HIGH (ref 8.7–10.2)
Chloride: 106 mmol/L (ref 96–106)
Creatinine, Ser: 0.88 mg/dL (ref 0.57–1.00)
Globulin, Total: 3 g/dL (ref 1.5–4.5)
Glucose: 113 mg/dL — ABNORMAL HIGH (ref 70–99)
Potassium: 4.4 mmol/L (ref 3.5–5.2)
Sodium: 142 mmol/L (ref 134–144)
Total Protein: 7.6 g/dL (ref 6.0–8.5)
eGFR: 79 mL/min/{1.73_m2} (ref >59)

## 2021-10-27 LAB — CBC WITH DIFFERENTIAL/PLATELET
Basophils Absolute: 0.1 10*3/uL (ref 0.0–0.2)
Basos: 1 %
EOS (ABSOLUTE): 0.1 10*3/uL (ref 0.0–0.4)
Eos: 2 %
Hematocrit: 40.5 % (ref 34.0–46.6)
Hemoglobin: 12.8 g/dL (ref 11.1–15.9)
Immature Grans (Abs): 0 10*3/uL (ref 0.0–0.1)
Immature Granulocytes: 0 %
Lymphocytes Absolute: 3.9 10*3/uL — ABNORMAL HIGH (ref 0.7–3.1)
Lymphs: 43 %
MCH: 26.9 pg (ref 26.6–33.0)
MCHC: 31.6 g/dL (ref 31.5–35.7)
MCV: 85 fL (ref 79–97)
Monocytes Absolute: 0.5 10*3/uL (ref 0.1–0.9)
Monocytes: 6 %
Neutrophils Absolute: 4.4 10*3/uL (ref 1.4–7.0)
Neutrophils: 48 %
Platelets: 309 10*3/uL (ref 150–450)
RBC: 4.76 x10E6/uL (ref 3.77–5.28)
RDW: 14.7 % (ref 11.7–15.4)
WBC: 9.1 10*3/uL (ref 3.4–10.8)

## 2021-10-27 LAB — VALPROIC ACID LEVEL: Valproic Acid Lvl: 73 ug/mL (ref 50–100)

## 2021-10-27 LAB — TOPIRAMATE LEVEL: Topiramate Lvl: 5.7 ug/mL (ref 2.0–25.0)

## 2021-10-31 ENCOUNTER — Ambulatory Visit: Payer: Managed Care, Other (non HMO) | Admitting: Neurology

## 2021-10-31 DIAGNOSIS — G40209 Localization-related (focal) (partial) symptomatic epilepsy and epileptic syndromes with complex partial seizures, not intractable, without status epilepticus: Secondary | ICD-10-CM | POA: Diagnosis not present

## 2021-10-31 NOTE — Procedures (Signed)
? ? ?  History: ? 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 ?EEG classification:  Awake and asleep ? ?Description of the recording: The background rhythms of this recording consists of a fairly well modulated medium amplitude background activity of 9 Hz. As the record progresses, the patient initially is in the waking state, but appears to enter the early stage II sleep during the recording, with rudimentary sleep spindles and vertex sharp wave activity seen. During the wakeful state, photic stimulation is performed, and no abnormal responses were seen. Hyperventilation was also performed, no abnormal response seen. No epileptiform discharges seen during this recording. There was no focal slowing. EKG monitor shows no evidence of cardiac rhythm abnormalities with a heart rate of 90. ? ?Abnormality: None  ? ?Impression: This is a normal EEG recording in the waking and sleeping state. No evidence interictal epileptiform discharges were seen at any time during the recording.  A normal EEG does not exclude a diagnosis of epilepsy.  ? ? ?Windell Norfolk, MD ?[facility]  ?

## 2021-11-01 ENCOUNTER — Other Ambulatory Visit: Payer: Self-pay | Admitting: Family Medicine

## 2021-11-01 ENCOUNTER — Telehealth: Payer: Self-pay | Admitting: Neurology

## 2021-11-01 DIAGNOSIS — G40209 Localization-related (focal) (partial) symptomatic epilepsy and epileptic syndromes with complex partial seizures, not intractable, without status epilepticus: Secondary | ICD-10-CM

## 2021-11-01 NOTE — Telephone Encounter (Signed)
Called the patient and reviewed the recent in office EEG results. Advised of the normal results. Informed the patient that Amy would like to pursue a ambulatory EEG over 72 hrs. Pt is agreeable to moving forward with this.  ?When questioned about the medication and receiving brand name she states she was not able to get brand. She is unsure why. I advised a brand name PA is on file and was approved for the patient until feb 2024. The pt should be able to get the med in brand name. Advised I will print approval for the pt and [facility]structed [facility] to [facility] to discuss further. Pt verbalized understanding. ? ?

## 2021-11-01 NOTE — Telephone Encounter (Signed)
-----   Message from Debbora Presto, NP sent at 11/01/2021  8:51 AM EDT ----- ?Can you guys call to make sure she was able to get brand Depakote and remind her that I will order the ambulatory EEG. TY! ?

## 2022-02-28 ENCOUNTER — Telehealth: Payer: Self-pay | Admitting: Neurology

## 2022-02-28 ENCOUNTER — Encounter: Payer: Self-pay | Admitting: Neurology

## 2022-02-28 ENCOUNTER — Ambulatory Visit: Payer: Managed Care, Other (non HMO) | Admitting: Neurology

## 2022-02-28 VITALS — BP 125/81 | HR 90 | Ht 65.0 in | Wt 174.0 lb

## 2022-02-28 DIAGNOSIS — G40209 Localization-related (focal) (partial) symptomatic epilepsy and epileptic syndromes with complex partial seizures, not intractable, without status epilepticus: Secondary | ICD-10-CM | POA: Diagnosis not present

## 2022-02-28 DIAGNOSIS — Z79899 Other long term (current) drug therapy: Secondary | ICD-10-CM

## 2022-02-28 MED ORDER — LAMOTRIGINE 25 MG PO TABS: ORAL_TABLET | ORAL | 3 refills | Status: DC

## 2022-02-28 NOTE — Patient Instructions (Signed)
Lamotrigine Tablets What is this medication? LAMOTRIGINE (la MOE tri jeen) prevents and controls seizures in people with epilepsy. It may also be used to treat bipolar disorder. It works by calming overactive nerves in your body. This medicine may be used for other purposes; ask your health care provider or pharmacist if you have questions. COMMON BRAND NAME(S): Lamictal, Subvenite What should I tell my care team before I take this medication? They need to know if you have any of these conditions: Heart disease History of irregular heartbeat Immune system problems Kidney disease Liver disease Low levels of folic acid in the blood Lupus Mental illness Suicidal thoughts, plans, or attempt; a previous suicide attempt by you or a family member An unusual or allergic reaction to lamotrigine or other seizure medications, other medications, foods, dyes, or preservatives Pregnant or trying to get pregnant Breast-feeding How should I use this medication? Take this medication by mouth with a glass of water. Follow the directions on the prescription label. Do not chew these tablets. If this medication upsets your stomach, take it with food or milk. Take your doses at regular intervals. Do not take your medication more often than directed. A special MedGuide will be given to you by the pharmacist with each new prescription and refill. Be sure to read this information carefully each time. Talk to your care team about the use of this medication in children. While this medication may be prescribed for children as young as 2 years for selected conditions, precautions do apply. Overdosage: If you think you have taken too much of this medicine contact a poison control center or emergency room at once. NOTE: This medicine is only for you. Do not share this medicine with others. What if I miss a dose? If you miss a dose, take it as soon as you can. If it is almost time for your next dose, take only that dose.  Do not take double or extra doses. What may interact with this medication? Atazanavir Birth control pills Certain medications for irregular heartbeat Certain medications for seizures like carbamazepine, phenobarbital, phenytoin, primidone, valproic acid Lopinavir Rifampin Ritonavir This list may not describe all possible interactions. Give your health care provider a list of all the medicines, herbs, non-prescription drugs, or dietary supplements you use. Also tell them if you smoke, drink alcohol, or use illegal drugs. Some items may interact with your medicine. What should I watch for while using this medication? Visit your care team for regular checks on your progress. If you take this medication for seizures, wear a Medic Alert bracelet or necklace. Carry an identification card with information about your condition, medications, and care team. It is important to take this medication exactly as directed. When first starting treatment, your dose will need to be adjusted slowly. It may take weeks or months before your dose is stable. You should contact your care team if your seizures get worse or if you have any new types of seizures. Do not stop taking this medication unless instructed by your care team. Stopping your medication suddenly can increase your seizures or their severity. This medication may cause serious skin reactions. They can happen weeks to months after starting the medication. Contact your care team right away if you notice fevers or flu-like symptoms with a rash. The rash may be red or purple and then turn into blisters or peeling of the skin. Or, you might notice a red rash with swelling of the face, lips or lymph nodes in your   neck or under your arms. You may get drowsy, dizzy, or have blurred vision. Do not drive, use machinery, or do anything that needs mental alertness until you know how this medication affects you. To reduce dizzy or fainting spells, do not sit or stand up  quickly, especially if you are an older patient. Alcohol can increase drowsiness and dizziness. Avoid alcoholic drinks. If you are taking this medication for bipolar disorder, it is important to report any changes in your mood to your care team. If your condition gets worse, you get mentally depressed, feel very hyperactive or manic, have difficulty sleeping, or have thoughts of hurting yourself or committing suicide, you need to get help from your care team right away. If you are a caregiver for someone taking this medication for bipolar disorder, you should also report these behavioral changes right away. The use of this medication may increase the chance of suicidal thoughts or actions. Pay special attention to how you are responding while on this medication. Your mouth may get dry. Chewing sugarless gum or sucking hard candy, and drinking plenty of water may help. Contact your care team if the problem does not go away or is severe. Women who become pregnant while using this medication may enroll in the [place] American Antiepileptic Drug Pregnancy Registry by calling 1-888-233-2334. This registry collects information about the safety of antiepileptic medication use during pregnancy. This medication may cause a decrease in folic acid. You should make sure that you get enough folic acid while you are taking this medication. Discuss the foods you eat and the vitamins you take with your care team. What side effects may I notice from receiving this medication? Side effects that you should report to your care team as soon as possible: Allergic reactions--skin rash, itching, hives, swelling of the face, lips, tongue, or throat Change in vision Fever, neck pain or stiffness, sensitivity to light, headache, nausea, vomiting, confusion Heart rhythm changes--fast or irregular heartbeat, dizziness, feeling faint or lightheaded, chest pain, trouble breathing Infection--fever, chills, cough, or sore throat Liver  injury--right upper belly pain, loss of appetite, nausea, light-colored stool, dark yellow or brown urine, yellowing skin or eyes, unusual weakness or fatigue Low red blood cell count--unusual weakness or fatigue, dizziness, headache, trouble breathing Rash, fever, and swollen lymph nodes Redness, blistering, peeling or loosening of the skin, including inside the mouth Thoughts of suicide or self-harm, worsening mood, or feelings of depression Unusual bruising or bleeding Side effects that usually do not require medical attention (report to your care team if they continue or are bothersome): Diarrhea Dizziness Drowsiness Headache Nausea Stomach pain Tremors or shaking This list may not describe all possible side effects. Call your doctor for medical advice about side effects. You may report side effects to FDA at 1-800-FDA-1088. Where should I keep my medication? Keep out of the reach of children and pets. Store at 25 degrees C (77 degrees F). Protect from light. Get rid of any unused medication after the expiration date. To get rid of medications that are no longer needed or have expired: Take the medication to a medication take-back program. Check with your pharmacy or law enforcement to find a location. If you cannot return the medication, check the label or package insert to see if the medication should be thrown out in the garbage or flushed down the toilet. If you are not sure, ask your care team. If it is safe to put it in the trash, empty the medication out of the   container. Mix the medication with cat litter, dirt, coffee grounds, or other unwanted substance. Seal the mixture in a bag or container. Put it in the trash. NOTE: This sheet is a summary. It may not cover all possible information. If you have questions about this medicine, talk to your doctor, pharmacist, or health care provider.  2023 Elsevier/Gold Standard (2020-10-01 00:00:00)      We will change this patient  slowly from Depakote to Lamictal , starting with 25 mg  twice a day  ( BID) for 4 weeks, than 50 mg bid for 4 weeks, than  75 mg lamictal bid and  next 4 weeks 100 mg Lamictal bid  with reduction of depakote generic ER form to 500 mg a day, 150 mg lamictal bid and off depakote.

## 2022-02-28 NOTE — Progress Notes (Addendum)
[facility]  PATIENT: Jacqueline [name] DOB: 07/31/1970   REASON FOR VISIT: Follow-up for complex partial seizure disorder HISTORY FROM: Patient    HISTORY OF PRESENT ILLNESS: 02-28-2022: RV with Jacqueline [name], who has had at least 4 more possible seizure events since last seen in this office by Jacqueline [name], here with concerns of medications not being effective enough.  Lots of stressors, no doses missed.  One weekend was extremely stressful, reunion with a twin sister , death and funeral of a niece.  We will change this patient slowly from Depakote to Lamictal , starting with 25 mg  twice a day  ( BID) for 4 weeks, than 50 mg bid for 4 weeks, than  75 mg Lamictal bid and  next 4 weeks 100 mg Lamictal bid with reduction of depakote generic ER form to 500 mg a day, 150 mg Lamictal bid and off Depakote.  She does not feel a need for counseling now.  She starts her seizures with a chuckle, a little laugh and then stares off and left hand will start rubbing the thigh or arm rest. She is right hand dominant. Lasting 20 seconds, and amnestic after event.  Needs liver functions test ( transaminases ) in 8 weeks and 12 weeks.      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 As the record progresses, the patient initially is in the waking state, but appears to enter the early stage II sleep during the recording, with rudimentary sleep spindles and vertex sharp wave activity seen. During the wakeful state, photic stimulation is performed, and no abnormal responses were seen. Hyperventilation was also performed, no abnormal response seen. No epileptiform discharges seen during this recording. There was no focal slowing. EKG monitor shows no evidence of cardiac rhythm abnormalities with a heart rate of 90.    Abnormality: None   Impression: This is a normal EEG recording in the waking and sleeping state. No evidence interictal epileptiform discharges were seen at any time during the recording.  A normal EEG does not exclude a diagnosis of epilepsy.      Jacqueline Ran, MD [facility]    AL : 11-01-2021:  Jacqueline [name] presents with her husband, today, who reports frequency of seizure activity has increased over the past year. He is not certain he agrees with her reports of only having 3-4 seizures the year prior. Jacqueline [name] agree that she has had at least 10 seizures over the past three months. She was switched from Depakote ER to generic divalproex in 10/2020 due to concerns of cost. She continued 1082m daily and topiramate 106mtwice daily. Although, I am unclear on true frequency of seizures reported at last visit, there has clearly been an increase in the past few months. I will attempt to switch her back to brand name Depakote ER continuing 100069maily for now. May adjust dose pending blood work. We will assess CBC, Chem and AED levels. I have ordered a repeat EEG. May consider ambulatory EEG if needed. She will follow up with Jacqueline [name] 4 months for worsening seizure frequency. Last seen by MD 2020. She will call with any new or worsening concerns. She verbalizes understanding and agreement with this plan.    Jacqueline [name] a meanwhile [age]ar old  african [place] female patient followed for infrequent seizures. She is now perimenopausal, last period was 2 weeks ago, spotting. She has a seizure in  May 8th-15 th, and was witnessed. Lasting 20 seconds, no fall to the ground, no tongue bite- just staring off, speech arrest, not convulsive. - its following an aura of dream-like changes of awareness.  Since her seizures were related to menstrual cycles in the past, she hopes this too will improve in menopause.  She quit smoking earlier this year, but relapsed, she smokes now  4 a day. No alcohol, no HRT or birth control pills.   She  has a history of complex partial seizures. She reports  seizures  during her menstrual cycle these are not every month and less frequent than previously, .  She reports , she will have a "funny sensation" at which point she will sit down. She may have staring spell she denies any loss of consciousness. She is aware of her surroundings.  There has been no evidence of generalized seizure , tongue biting bruises, incontinence etc. She has no fatigue or headache afterwards. She is currently generic Depakote.and  Topamax  due to expense.  So far she has not noticed any difference. She denies any balance issues.    HISTORY 07/06/14 [name] is a 51 y.o. female. She carries the diagnosis of seizures and has been seizure free for 8 years, since being treated at [facility] .  She had seizure related to her menstrual cycle.   She has never seen me before, she reported, she was followed by Jacqueline. [name] and Jacqueline [name], [facility].   Jacqueline [name] is a right-handed African-American female who was last seen by Jacqueline [name] on 05-21-13. She has a history of complex partial seizures that MRIs in the past has been negative. Her spells were announced by a "funny sensation", That she could not control . The spells followed quickly after, staring and feeling "interrupted. She would miss her turn in a card game and miss parts of conversations. These very brief spells were never leading to a generalized convulsion and she never lost complete awareness of her surroundings they were not followed by any headaches bite marks unexplained bruises or a sudden degree of fatigue. Depakote was added to her medication in February 2009 in October 2090 at her last seizure.    She takes Topamax at 200 mg daily and Depakote at 1000 mg daily.   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 She gained weight over the years on  Depakote, over 100 pounds. She reached a plateau    REVIEW OF SYSTEMS: Full 14 system review of systems performed and notable only for those listed, all others are neg:   Recurrent staring attacks, spells of altered consciousness. No menopausal sleep problems.    ALLERGIES: No Known Allergies  HOME MEDICATIONS: Outpatient Medications Prior to Visit  Medication Sig Dispense Refill   ACCU-CHEK GUIDE test strip USE TO TEST BLOOD GLUCOSE UP TO 4 TIMES A DAY 100 strip 2   Accu-Chek Softclix Lancets lancets USE AS DIRECTED UP TO 4 TIMES A DAY 100 each 1   atorvastatin (LIPITOR) 20 MG tablet Take 1 tablet (20 mg total) by mouth daily. 90 tablet 3   blood glucose meter kit and supplies Dispense based on patient and insurance preference. Use up to four times daily as directed. (FOR ICD-10 E10.9, E11.9). 1 each 0   Cholecalciferol (  VITAMIN D3 GUMMIES ADULT) 25 MCG (1000 UT) CHEW Chew by mouth.     DEPAKOTE ER 500 MG 24 hr tablet Take 2 tablets (1,000 mg total) by mouth daily. 180 tablet 3   metFORMIN (GLUCOPHAGE) 1000 MG tablet Take 0.5 tablets (500 mg total) by mouth daily with breakfast. 90 tablet 1   Prenatal Vit-Fe Fumarate-FA (PRENATAL VITAMIN PO) Take by mouth. One a day vitamin takes one tablet daily     topiramate (TOPAMAX) 100 MG tablet Take 1 tablet (100 mg total) by mouth 2 (two) times daily. 180 tablet 3   No facility-administered medications prior to visit.    PAST MEDICAL HISTORY: Past Medical History:  Diagnosis Date   Seizure disorder ([place])    Seizures ([place])     PAST SURGICAL HISTORY: Past Surgical History:  Procedure Laterality Date   broken nose     in high school    FAMILY HISTORY: Family History  Problem Relation Age of Onset   Diabetes Father    Seizures Brother     SOCIAL HISTORY: Social History   Socioeconomic History   Marital status: Married    Spouse name: Jacqueline [name]   Number of children: 2   Years of education: college   Highest education level:  Not on file  Occupational History   Occupation: Inventory control    Comment: [employer]  Tobacco Use   Smoking status: Former    Packs/day: 0.00    Types: E-cigarettes, Cigarettes    Quit date: 08/22/2016    Years since quitting: 5.5   Smokeless tobacco: Never   Tobacco comments:    quit smoking february 2018  Substance and Sexual Activity   Alcohol use: No   Drug use: No   Sexual activity: Not on file  Other Topics Concern   Not on file  Social History Narrative   Patient is married Jacqueline [name]) and lives at home with her family.   Patient has 2 children.   Patient works in Pharmacist, hospital for [employer].   Patient has a college education.   Patient drinks 1 cup of caffeine daily.      Social Determinants of Health   Financial Resource Strain: Not on file  Food Insecurity: Not on file  Transportation Needs: Not on file  Physical Activity: Not on file  Stress: Not on file  Social Connections: Not on file  Intimate Partner Violence: Not on file     PHYSICAL EXAM  Vitals:   02/28/22 0809  BP: 125/81  Pulse: 90  Weight: 174 lb (78.9 kg)  Height: '5\' 5"'  (1.651 m)   Body mass index is 28.96 kg/m.  No ankle edema.    Physical Exam General: well developed, well nourished, moderately obese female seated, in no evident distress Head: head normocephalic and atraumatic. Oropharynx benign. Hair loss.  Neck: supple, full ROM.    Neurologic Exam Mental Status: Awake and fully alert. Oriented to place and time. Follows all commands. Speech and language are normal. Eloquent .   No loss of taste or smell/ Vision 20/20 .   Cranial Nerves: Pupils equal in size and bilaterally briskly reactive to light. Extraocular movements full without nystagmus. Visual fields full to confrontation.  Hearing intact and symmetric to finger snap.no tinnitus, no vertigo.   Facial sensation intact. Face, tongue, palate move normally and symmetrically.  Neck flexion and extension  normal.   Motor: Normal bulk and tone. Normal strength in all tested extremity muscles.No focal weakness.  Sensory: intact to  touch  in the upper and lower extremities.  Coordination: Rapid alternating movements normal in all extremities. Finger-to-nose n performed accurately bilaterally.  Gait and Station: Arises from being seated in a chair without difficulty. Not bracing. Stance is normal in width and stabile. . Gait demonstrates normal stride length and balance .  Able to heel, toe and tandem walk without difficulty.  Reflexes: Symmetric upper and lower. Toes downgoing.    DIAGNOSTIC DATA (LABS, IMAGING, TESTING) - I reviewed patient records, labs, notes, testing and imaging myself where available.  Lab Results  Component Value Date   WBC 9.1 10/26/2021   HGB 12.8 10/26/2021   HCT 40.5 10/26/2021   MCV 85 10/26/2021   PLT 309 10/26/2021      Component Value Date/Time   NA 142 10/26/2021 0817   K 4.4 10/26/2021 0817   CL 106 10/26/2021 0817   CO2 20 10/26/2021 0817   GLUCOSE 113 (H) 10/26/2021 0817   BUN 9 10/26/2021 0817   CREATININE 0.88 10/26/2021 0817   CALCIUM 10.3 (H) 10/26/2021 0817   PROT 7.6 10/26/2021 0817   ALBUMIN 4.6 10/26/2021 0817   AST 30 10/26/2021 0817   ALT 27 10/26/2021 0817   ALKPHOS 85 10/26/2021 0817   BILITOT 0.3 10/26/2021 0817   GFRNONAA 83 10/31/2019 1420   GFRAA 96 10/31/2019 1420    ASSESSMENT AND PLAN 51 y.o. year old female  has a past medical history of Seizure disorder ([place]). here to follow-up.   Presents for follow up. Had a EEG in may 2023. Last seizure was aug 17,2023 7:20 in the morning and prior to that 8/4.(8/4 was late with taking medication) prior to 8/4 was in May. Pt takes topiramate 100 mg BID with the depatoke ER it is ordered to take 2 tablet daily- pt is taking 1 tab in am and 1 in evening. Would like to clarify if she should take 2 together at the same time daily or continue to split them.She is  on  generic Topamax  and   Depakote. She has ongoing seizure spells , described as starting with a short laughter, a chuckle.  She had  "Spells" occasionally during her menstrual cycles where she will have a "funny sensation" and may have a staring spell. She is no longer having periods- last 04-2021.  There is no evidence of tongue biting, incontinence etc. No LOC. These episodes last about 20 seconds or less.  They have become less frequent over the years. She is taking prenatal vitamins for prevention of neuropathy .    PLAN:  Will check level of Depakote to monitor for therapeutic level as well as adverse side effects We will check CBC and CMP to monitor for adverse effects  Continue Depakote for now at current dose /  Continue Topamax 100 mg twice daily.  We will change this patient slowly from Depakote to Lamictal , starting with 25 mg  twice a day  ( BID) for 4 weeks, than 50 mg bid for 4 weeks, than  75 mg lamictal bid and after the next 4 weeks to 100 mg Lamictal bid  with simultaneous reduction of depakote generic ER form to 500 mg a day,  then 150 mg lamictal bid and off depakote.  Follow-up in 3 and 5 months with Jacqueline . I spent 28 minutes  in total face to face time with the patient more than 50% of which was spent counseling and coordination of care, reviewing test results reviewing medications and discussing  and reviewing the diagnosis of seizure disorder and checking levels for adverse effects/ therapeutic levels.     Larey Seat , MD   [facility] [address] [place], [place] [address] [phone]

## 2022-02-28 NOTE — Addendum Note (Signed)
Addended by: Melvyn Novas on: 02/28/2022 09:08 AM   Modules accepted: Orders

## 2022-02-28 NOTE — Telephone Encounter (Signed)
Completed form for [facility] and faxed everything in to them for them to get her set up with the 72 hr EEG. Received confirmation fax was received

## 2022-03-02 LAB — VALPROIC ACID LEVEL: Valproic Acid Lvl: 76 ug/mL (ref 50–100)

## 2022-03-02 NOTE — Progress Notes (Signed)
Normal VPA level- this level can rise as we titrate up on Lamictal . Next blood draw in 6 weeks with CMET and VPA and Lamotrigine levels.

## 2022-03-03 ENCOUNTER — Encounter: Payer: Self-pay | Admitting: *Deleted

## 2022-03-09 NOTE — Telephone Encounter (Signed)
Received the following email update today: "Good afternoon, I just wanted to let you know that Jacqueline [name](DOB: 1971/06/13) is scheduled for their In-Home Video EEG from 10/13-10/16, per request. Please let us know if you have any questions or comments"

## 2022-04-14 ENCOUNTER — Other Ambulatory Visit: Payer: Self-pay

## 2022-04-14 ENCOUNTER — Telehealth: Payer: Self-pay

## 2022-04-14 DIAGNOSIS — R7303 Prediabetes: Secondary | ICD-10-CM

## 2022-04-14 MED ORDER — METFORMIN HCL 1000 MG PO TABS: 500.0000 mg | ORAL_TABLET | Freq: Every day | ORAL | 1 refills | Status: AC

## 2022-04-14 NOTE — Telephone Encounter (Signed)
Pharmacy sent a refill request on pt Rx Metformin . Looking thru her chart it has not been refilled since 2022. I did call and leave the pt a VM asking her to call the office to let us know if she has est care with a new provider and if she is still taking this medication

## 2022-04-14 NOTE — Telephone Encounter (Signed)
Called patient informed her rx had been sent she intends to call back in December and get on the schedule to see the new provider when she starts in January

## 2022-04-14 NOTE — Telephone Encounter (Signed)
PT called back; is still taking mefformin and does need  refill; has not established with a new PCP but it in the process of establishing with another provider. Pt also notes that she is only taking 1 pill per day and not BID.

## 2022-05-09 NOTE — Telephone Encounter (Addendum)
Received email update from [place] 11/02: "Jacqueline [name]  (DOB:06/02/1944) is scheduled for their In-Home Video EEG from 06/13/22-06/15/22"

## 2022-06-27 ENCOUNTER — Ambulatory Visit: Payer: Managed Care, Other (non HMO) | Admitting: Family Medicine

## 2022-07-04 ENCOUNTER — Encounter: Payer: Self-pay | Admitting: Family Medicine

## 2022-07-04 ENCOUNTER — Ambulatory Visit: Payer: Managed Care, Other (non HMO) | Admitting: Family Medicine

## 2022-07-04 VITALS — BP 108/67 | HR 97 | Ht 65.0 in | Wt 173.0 lb

## 2022-07-04 DIAGNOSIS — G40209 Localization-related (focal) (partial) symptomatic epilepsy and epileptic syndromes with complex partial seizures, not intractable, without status epilepticus: Secondary | ICD-10-CM | POA: Diagnosis not present

## 2022-07-04 NOTE — Progress Notes (Unsigned)
PATIENT: Jacqueline [name] DOB: [birth year]  REASON FOR VISIT: follow up HISTORY FROM: patient  Chief Complaint  Patient presents with   Follow-up    RM 2 with spouse.  Has had 6 seizures since last visit (03/26/22, 04/29/22, 05/04/22, 05/14/22, 05/27/22 ,06/02/22). Had a couple times where she took medication a little late in the morning prior to seizure events.      HISTORY OF PRESENT ILLNESS:  07/04/22 ALL:  Jacqueline [name] returns for follow up for seizures. She was last seen by Dr Brett Fairy 01/2022 and reported continued seizure activity in setting of increased stress. She was started on lamotrigine with plans to wean Depakote. Topiramate 168m BID continued. EEG was normal. Ambulatory EEG ordered but cancelled. She reports this has been rescheduled for 08/18/2022. She reports that lamotrigine caused cracking of lips, two lumps on right side of neck and neck stiffness. She d/c'd after two weeks. She has continued Depakote ER 10059mQD. She reports five events, all witnessed by her husband, since last visit in 01/2022. He describes events typically start with Jacqueline [name] a laughing noise then he notes her rubbing/shaking/tapping her hand, usually right. Event lasts 20-40 seconds. She seems disoriented and very tired afterwards. Events occur at random times. Fortunately, no injuries. She denies missed doses. May have slight variation in dosage time (1-2 hour). She is not driving.   She gambles and reports flashing lights of casino could be trigger. No recent seizure events after gambling.   [facility]  [phone]/26/2023 ALL: KaLaynieturns for follow up for seizures. She continues brand Depakote ER 100014maily and topiramate 100m60mD. She reports tolerating both medications well with no obvious adverse effects.   She reports multiple seizures over the last year. Her husband is with her, today and aids in history. He has a video capturing an event 02/25/2021.  She becomes unresponsive, eyes veer to right and she is unable to use right hand. No tonic clonic movements. Events last about 30-60 seconds. She seems confused for a minute or so following seizure then back to baseline.    She called 12/2020 reporting expense with brand Depakote. Per Dr DohmBrett Fairy to use generic as she was taking topiramate as well. Mr and Jacqueline WoodHentonh feel that seizure frequency has increased following change to generic divalproex. Mr WoodLockamyimates that she has had at least 10 seizure events over the past three months.   No significant changes in sleep patterns or hydration status. She has decreased dose of metformin from 500mg33m to QD. A1C has been well managed. She has focused on healthy well balanced meals. She endorses more stress since switching jobs in 04/2021. 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 She is due for CPE with PCP.   10/21/2020 ALL:  She returns for follow up for seizures. She continues to do well on Depakote ER 1000mg 1my and topiramate 100mg B37mShe feels that she is doing very well. She reports that seizures continue to decrease in frequency. She had 3-4 over the past year. She is tolerating medications well. Labs have been stable. She is now taking metformin. A1C dropped from >12 to 6 after starting metformin and changing diet. She has lost 7 pounds. She is feeling great.   10/22/2019 ALL:  Jacqueline Gianelle Mccaul1 y.o.35emale here today for follow up for seizures. She continues Depakote ER 1000mg da54mand topiramate 100mg twi9maily. She reports having a seizure about 1-2  weeks prior to menses 10/14/2019. She thinks last seizure was April 1st. She usually has one event prior to start of menses. She is having irregular menses over the past year. Event usually lasts about 20-30 seconds. She will stare off to the side. She is unresponsive during event. She has some mild fatigue but denies post ictal confusion. No tonic clonic movements. No incontinence  or tongue injuries. She does not drive. 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   HISTORY: (copied from Dr Dohmeier's note on 01/21/2019)  Jacqueline. Jacqueline [name] is a meanwhile 52 year old african american female patient followed for infrequent seizures. She is now perimenopausal, last period was 2 weeks ago, spotting. She has a seizure in  May 8th-15 th, and was witnessed. Lasting 20 seconds, no fall to the ground, no tongue bite- just staring off, speech arrest, not convulsive. - its following an aura of dream-like changes of awareness.  Since her seizures were related to menstrual cycles in the past, she hopes this too will improve in menopause.  She quit smoking earlier this year, but relapsed, she smokes now 4 a day. No alcohol, no HRT or birth control pills.   She  has a history of complex partial seizures. She reports  seizures  during her menstrual cycle these are not every month and less frequent than previously, .  She reports , she will have a "funny sensation" at which point she will sit down. She may have staring spell she denies any loss of consciousness. She is aware of her surroundings.  There has been no evidence of generalized seizure , tongue biting bruises, incontinence etc. She has no fatigue or headache afterwards. She is currently generic Depakote.and  Topamax  due to expense.  So far she has not noticed any difference. She denies any balance issues.      HISTORY 07/06/14 [name] is a 52 y.o. female. She carries the diagnosis of seizures and has been seizure free for 8 years, since being treated at [facility] .  She had seizure related to her menstrual cycle.   She has never seen me before, she reported, she was followed by Dr. [name] and Cecille Rubin, [facility].   Jacqueline [name] is a right-handed African-American female who was last seen by Cecille Rubin on 05-21-13. She has a history of complex partial seizures that MRIs in the past has been negative. Her  spells were announced by a "funny sensation", That she could not control . The spells followed quickly after, staring and feeling "interrupted. She would miss her turn in a card game and miss parts of conversations. These very brief spells were never leading to a generalized convulsion and she never lost complete awareness of her surroundings they were not followed by any headaches bite marks unexplained bruises or a sudden degree of fatigue. Depakote was added to her medication in February 2009 in October 2090 at her last seizure.    She takes Topamax at 200 mg daily and Depakote at 1000 mg daily.   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 She gained weight over the years on Depakote, over 100 pounds. She reached a plateau    REVIEW OF SYSTEMS: Out of a complete 14 system review of symptoms, the patient complains only of the following symptoms, seizures and all other reviewed systems are negative.  ALLERGIES: No Known Allergies  HOME MEDICATIONS:  Outpatient Medications Prior to Visit  Medication Sig Dispense Refill   ACCU-CHEK GUIDE test strip USE TO TEST BLOOD GLUCOSE UP TO 4 TIMES A DAY 100 strip 2   Accu-Chek Softclix Lancets lancets USE AS DIRECTED UP TO 4 TIMES A DAY 100 each 1   atorvastatin (LIPITOR) 20 MG tablet Take 1 tablet (20 mg total) by mouth daily. 90 tablet 3   blood glucose meter kit and supplies Dispense based on patient and insurance preference. Use up to four times daily as directed. (FOR ICD-10 E10.9, E11.9). 1 each 0   Cholecalciferol (VITAMIN D3 GUMMIES ADULT) 25 MCG (1000 UT) CHEW Chew by mouth.     DEPAKOTE ER 500 MG 24 hr tablet Take 2 tablets (1,000 mg total) by mouth daily. 180 tablet 3   metFORMIN (GLUCOPHAGE) 1000 MG tablet Take 0.5 tablets (500 mg total) by mouth daily with breakfast. 90 tablet 1   Prenatal Vit-Fe Fumarate-FA (PRENATAL VITAMIN PO) Take by mouth. One a day vitamin takes one tablet  daily     topiramate (TOPAMAX) 100 MG tablet Take 1 tablet (100 mg total) by mouth 2 (two) times daily. 180 tablet 3   lamoTRIgine (LAMICTAL) 25 MG tablet Titration -We will change this patient slowly from Depakote to Lamictal , starting with Lamictal 25 mg  twice a day  ( BID) for 4 weeks, than 50 mg bid for 4 weeks, than  75 mg lamictal bid and  next 4 weeks 100 mg Lamictal bid  with reduction of depakote generic ER form to 500 mg a day, then ( 4 weeks later ) going on to 150 mg lamictal bid and off depakote. 180 tablet 3   No facility-administered medications prior to visit.    PAST MEDICAL HISTORY: Past Medical History:  Diagnosis Date   Seizure disorder ([place])    Seizures ([place])     PAST SURGICAL HISTORY: Past Surgical History:  Procedure Laterality Date   broken nose     in high school    FAMILY HISTORY: Family History  Problem Relation Age of Onset   Diabetes Father    Seizures Brother     SOCIAL HISTORY: Social History   Socioeconomic History   Marital status: Married    Spouse name: Legrand Como   Number of children: 2   Years of education: college   Highest education level: Not on file  Occupational History   Occupation: Inventory control    Comment: [employer]  Tobacco Use   Smoking status: Former    Packs/day: 0.00    Types: E-cigarettes, Cigarettes    Quit date: 08/22/2016    Years since quitting: 5.8   Smokeless tobacco: Never   Tobacco comments:    quit smoking february 2018  Substance and Sexual Activity   Alcohol use: No   Drug use: No   Sexual activity: Not on file  Other Topics Concern   Not on file  Social History Narrative   Patient is married Legrand Como) and lives at home with her family.   Patient has 2 children.   Patient works in Pharmacist, hospital for [employer].   Patient has a college education.   Patient drinks 1 cup of caffeine daily.      Social Determinants of Health   Financial Resource Strain: Not on file  Food  Insecurity: Not on file  Transportation Needs: Not on file  Physical Activity: Not on file  Stress: Not on file  Social Connections: Not on file  Intimate Partner  Violence: Not on file      PHYSICAL EXAM  Vitals:   07/04/22 0915  BP: 108/67  Pulse: 97  Weight: 173 lb (78.5 kg)  Height: _0  (1.651 m)     Body mass index is 28.79 kg/m.  Physical Exam General: well developed, well nourished, moderately obese female seated, in no evident distress Head: head normocephalic and atraumatic. Oropharynx benign Neck: supple  Neurologic Exam Mental Status: Awake and fully alert. Oriented to place and time. Follows all commands. Speech and language normal.   Cranial Nerves:  Pupils equal, briskly reactive to light. Extraocular movements full without nystagmus. Visual fields full to confrontation. Hearing intact and symmetric to finger snap. Facial sensation intact. Face, tongue, palate move normally and symmetrically. Neck flexion and extension normal.  Motor: Normal bulk and tone. Normal strength in all tested extremity muscles.No focal weakness, very slight tremor of right hand noted, patient reports due to fasting status Sensory.: intact to touch and pinprick and vibratory in the upper and lower extremities.  Coordination: Finger-to-nose and heel-to-shin performed accurately bilaterally. Gait and Station: Arises from chair without difficulty. Stance is normal. Gait normal, steady without assistive device.  Reflexes: Symmetric upper and lower.   DIAGNOSTIC DATA (LABS, IMAGING, TESTING) - I reviewed patient records, labs, notes, testing and imaging myself where available.      No data to display           Lab Results  Component Value Date   WBC 9.1 10/26/2021   HGB 12.8 10/26/2021   HCT 40.5 10/26/2021   MCV 85 10/26/2021   PLT 309 10/26/2021      Component Value Date/Time   NA 142 10/26/2021 0817   K 4.4 10/26/2021 0817   CL 106 10/26/2021 0817   CO2 20 10/26/2021  0817   GLUCOSE 113 (H) 10/26/2021 0817   BUN 9 10/26/2021 0817   CREATININE 0.88 10/26/2021 0817   CALCIUM 10.3 (H) 10/26/2021 0817   PROT 7.6 10/26/2021 0817   ALBUMIN 4.6 10/26/2021 0817   AST 30 10/26/2021 0817   ALT 27 10/26/2021 0817   ALKPHOS 85 10/26/2021 0817   BILITOT 0.3 10/26/2021 0817   GFRNONAA 83 10/31/2019 1420   GFRAA 96 10/31/2019 1420   Lab Results  Component Value Date   CHOL 194 10/31/2019   HDL 39 (L) 10/31/2019   LDLCALC 129 (H) 10/31/2019   TRIG 142 10/31/2019   CHOLHDL 5.0 (H) 10/31/2019   Lab Results  Component Value Date   HGBA1C 6.1 (A) 02/17/2021   No results found for: "VITAMINB12" Lab Results  Component Value Date   TSH 0.737 10/31/2019     ASSESSMENT AND PLAN 52 y.o. year old female  has a past medical history of Seizure disorder ([place]) and Seizures ([place]). here with     ICD-10-CM   1. Partial symptomatic epilepsy with complex partial seizures, not intractable, without status epilepticus ([place])  G40.209        Joslin presents with her husband, today, who reports continued seizure activity. She did not tolerate lamotrigine. She will Depakote ER 1048m daily and topiramate 10472mBID. I will add Onfi 72m47maily for 1 week then increase dose to 58m17mily. May increase dose based on response. We will assess CBC, Chem and AED levels. Ambulatory EEG scheduled for 08/2022. She will follow up with Dr DohmBrett Fairy4 months. She will call with any new or worsening concerns. She verbalizes understanding and agreement with this plan.  No orders of the defined types were placed in this encounter.    No orders of the defined types were placed in this encounter.     I spent 40 minutes of face-to-face and non-face-to-face time with patient.  This included previsit chart review, lab review, study review, order entry, electronic health record documentation, patient education.   Debbora Presto, FNP-C 07/04/2022, 9:28 AM [facility] [address] [place], [place] [address] [phone]

## 2022-07-04 NOTE — Patient Instructions (Signed)
Below is our plan:  We will continue Depakote ER 1000mg  daily, topiramate 100mg  twice daily and we will add Onfi 10mg  daily. Start with 1/2 tablet (5mg ) daily for 1 week then increase dose. I have included information in the summary regarding side effects. Please let me know if you have any concerns.   I will update labs. Please let me know immediately of any changes in medications or if you are unable complete EEG as scheduled.   [facility]  [phone]  Please make sure you are consistent with timing of seizure medication. I recommend annual visit with primary care provider (PCP) for complete physical and routine blood work. I recommend daily intake of vitamin D (400-800iu) and calcium (800-1000mg ) for bone health. Discuss Dexa screening with PCP.   According to [place] law, you can not drive unless you are seizure / syncope free for at least 6 months and under physician's care.  Please maintain precautions. Do not participate in activities where a loss of awareness could harm you or someone else. No swimming alone, no tub bathing, no hot tubs, no driving, no operating motorized vehicles (cars, ATVs, motocycles, etc), lawnmowers, power tools or firearms. No standing at heights, such as rooftops, ladders or stairs. Avoid hot objects such as stoves, heaters, open fires. Wear a helmet when riding a bicycle, scooter, skateboard, etc. and avoid areas of traffic. Set your water heater to 120 degrees or less.  Please make sure you are staying well hydrated. I recommend 50-60 ounces daily. Well balanced diet and regular exercise encouraged. Consistent sleep schedule with 6-8 hours recommended.   Please continue follow up with care team as directed.   Follow up with Dr Brett Fairy at her first available.   You may receive a survey regarding today's visit. I encourage you to leave honest feed back as I do use this information to improve patient care. Thank you for seeing me  today!

## 2022-07-05 ENCOUNTER — Encounter: Payer: Self-pay | Admitting: Family Medicine

## 2022-07-05 LAB — CBC WITH DIFFERENTIAL/PLATELET
Basophils Absolute: 0.1 10*3/uL (ref 0.0–0.2)
Basos: 1 %
EOS (ABSOLUTE): 0.2 10*3/uL (ref 0.0–0.4)
Eos: 2 %
Hematocrit: 38.4 % (ref 34.0–46.6)
Hemoglobin: 12.5 g/dL (ref 11.1–15.9)
Immature Grans (Abs): 0.1 10*3/uL (ref 0.0–0.1)
Immature Granulocytes: 1 %
Lymphocytes Absolute: 3.2 10*3/uL — ABNORMAL HIGH (ref 0.7–3.1)
Lymphs: 38 %
MCH: 28.4 pg (ref 26.6–33.0)
MCHC: 32.6 g/dL (ref 31.5–35.7)
MCV: 87 fL (ref 79–97)
Monocytes Absolute: 0.6 10*3/uL (ref 0.1–0.9)
Monocytes: 7 %
Neutrophils Absolute: 4.4 10*3/uL (ref 1.4–7.0)
Neutrophils: 51 %
Platelets: 288 10*3/uL (ref 150–450)
RBC: 4.4 x10E6/uL (ref 3.77–5.28)
RDW: 14.2 % (ref 11.7–15.4)
WBC: 8.5 10*3/uL (ref 3.4–10.8)

## 2022-07-05 LAB — COMPREHENSIVE METABOLIC PANEL
ALT: 13 IU/L (ref 0–32)
AST: 17 IU/L (ref 0–40)
Albumin/Globulin Ratio: 1.3 (ref 1.2–2.2)
Albumin: 4.1 g/dL (ref 3.8–4.9)
Alkaline Phosphatase: 89 IU/L (ref 44–121)
BUN/Creatinine Ratio: 8 — ABNORMAL LOW (ref 9–23)
BUN: 8 mg/dL (ref 6–24)
Bilirubin Total: 0.3 mg/dL (ref 0.0–1.2)
CO2: 23 mmol/L (ref 20–29)
Calcium: 9.6 mg/dL (ref 8.7–10.2)
Chloride: 106 mmol/L (ref 96–106)
Creatinine, Ser: 0.95 mg/dL (ref 0.57–1.00)
Globulin, Total: 3.1 g/dL (ref 1.5–4.5)
Glucose: 204 mg/dL — ABNORMAL HIGH (ref 70–99)
Potassium: 4.5 mmol/L (ref 3.5–5.2)
Sodium: 143 mmol/L (ref 134–144)
Total Protein: 7.2 g/dL (ref 6.0–8.5)
eGFR: 72 mL/min/{1.73_m2} (ref >59)

## 2022-07-05 LAB — VALPROIC ACID LEVEL: Valproic Acid Lvl: 98 ug/mL (ref 50–100)

## 2022-07-05 MED ORDER — CLOBAZAM 10 MG PO TABS: 10.0000 mg | ORAL_TABLET | Freq: Every day | ORAL | 1 refills | Status: DC

## 2022-07-06 ENCOUNTER — Telehealth: Payer: Self-pay | Admitting: *Deleted

## 2022-07-06 NOTE — Telephone Encounter (Signed)
Submitted PA clobazam on covermymeds. Key: [id number]. Received instant approval: "CaseId:[id number];Status:Approved;Review Type:Prior Auth;Coverage Start Date:06/06/2022;Coverage End Date:07/06/2023;"

## 2022-08-29 ENCOUNTER — Ambulatory Visit: Payer: Managed Care, Other (non HMO) | Admitting: Neurology

## 2022-09-11 ENCOUNTER — Telehealth: Payer: Self-pay | Admitting: Family Medicine

## 2022-09-11 NOTE — Telephone Encounter (Signed)
..   Pt understands that although there may be some limitations with this type of visit, we will take all precautions to reduce any security or privacy concerns.  Pt understands that this will be treated like an in office visit and we will file with pt's insurance, and there may be a patient responsible charge related to this service. ? ?

## 2022-09-11 NOTE — Telephone Encounter (Signed)
Appears they scheduled mychart VV with Dr. Brett Fairy 09/13/22-FYI

## 2022-09-13 ENCOUNTER — Telehealth: Payer: Managed Care, Other (non HMO) | Admitting: Neurology

## 2022-09-13 ENCOUNTER — Encounter: Payer: Self-pay | Admitting: Neurology

## 2022-09-13 DIAGNOSIS — Z79899 Other long term (current) drug therapy: Secondary | ICD-10-CM | POA: Diagnosis not present

## 2022-09-13 DIAGNOSIS — G40209 Localization-related (focal) (partial) symptomatic epilepsy and epileptic syndromes with complex partial seizures, not intractable, without status epilepticus: Secondary | ICD-10-CM | POA: Diagnosis not present

## 2022-09-13 NOTE — Progress Notes (Signed)
Virtual Visit via Video Note  I connected with Jacqueline [name] on 09/13/22 at  3:30 PM EDT by a video enabled telemedicine application and verified that I am speaking with the correct person using two identifiers.  Location: Patient: at home  Provider: at [facility]   I discussed the limitations of evaluation and management by telemedicine and the availability of in person appointments. The patient expressed understanding and agreed to proceed.  History of Present Illness:  she has daytime and nocturnal sleep seizures, she laughs in her sleep. She usually has an aura in daytime. She has had 2 seizures in the last 4 months.  Last appointment with Debbora Presto in January 2024.  She needs no refills at this time.   seizure patient waiting for ambulatory long time EEG.  The company has cancelled the appointment 2 or 3 times. Christmas, January and 3 weeks ago. The RN is not available, but technologist is available (?).   She was willing to undergo EMU testing.  Husband and mother in law would need to be available and patient feels that is not a possibility.  MIL is elderly and husband works from home.     Observations/Objective: had one recent seizure activity , 08-04-2022, her B day which she shares with her now  one year old granddaughter. She was excited , and sleep deprived, at 3 PM  during the birthday party . She felt the aura and could set down, seizures lasted 15-30 second duration, with a  laugh at onset- and postictal sedation for 2 minutes.   Assessment and Plan: We will need to find an alternative EEG company for ambulatory EEG.  Referral for home EEG ambulatory monitoring, interpretation with Dr April Manson.  We will let the patient know about our progress.      I discussed the assessment and treatment plan with the patient. The patient was provided an opportunity to ask questions and all were answered. The patient agreed with the plan and demonstrated an understanding of the instructions.   The  patient was advised to call back or seek an in-person evaluation if the symptoms worsen or if the condition fails to improve as anticipated.  I provided 21 minutes of non-face-to-face time during this encounter.   Larey Seat, MD

## 2022-09-13 NOTE — Patient Instructions (Signed)
We will need to find an alternative EEG company for ambulatory EEG.

## 2022-10-30 ENCOUNTER — Telehealth: Payer: Self-pay | Admitting: *Deleted

## 2022-10-30 NOTE — Telephone Encounter (Signed)
I called patient regarding Amy message:   "Lomax, Amy, NP  [facility] Calls Can you guys check to see that she was able to get ambulatory EEG set up after visit with Dr Vickey Huger. I see plan in her note but no documentation of procedure being scheduled. TY!  Pt said the company kept rescheduling her because they didn't have a nurse to come to her house to do the study. Pt would like use a different company,( was using Therapist, occupational ).

## 2022-10-31 NOTE — Telephone Encounter (Signed)
I emailed Amari from [employer] [facility] regarding what the patient told me. Amari e-mail response was   "Merrily Pew,  We originally received Ms. Christena Deem in May of 2023. We scheduled her appointment but she cancelled twice. The third rescheduled appointment our technician had a personal emergency that caused [place] to reschedule. When reaching out to Ms. Luepke to reschedule she was unresponsive. If the patient is open to testing we are certainly willing to move forward as well. Please let me know if you have any questions.  Thank you"

## 2022-10-31 NOTE — Telephone Encounter (Addendum)
I called and spoke with patient about what I was told by [facility], I explained this is [facility] preferred location pt is willing to schedule, she was fine with trying [facility] again. She preferred they email her to schedule I sent a email to Bunn with [facility] relaying patient's preferred method of communication. I asked the patient to let us know when she is scheduled so it can be documented in her chart, pt verbalized she would send a my chart message letting the office know EEG date.

## 2022-12-13 ENCOUNTER — Other Ambulatory Visit: Payer: Self-pay | Admitting: Neurology

## 2022-12-13 ENCOUNTER — Telehealth: Payer: Self-pay | Admitting: Family Medicine

## 2022-12-13 MED ORDER — DIVALPROEX SODIUM ER 500 MG PO TB24: 1000.0000 mg | ORAL_TABLET | Freq: Every day | ORAL | 0 refills | Status: DC

## 2022-12-13 MED ORDER — DEPAKOTE ER 500 MG PO TB24: 1000.0000 mg | ORAL_TABLET | Freq: Every day | ORAL | 0 refills | Status: DC

## 2022-12-13 NOTE — Telephone Encounter (Signed)
Pt requesting a refill on DEPAKOTE ER 500 MG 24 hr tablet. Should be sent to  [id number]  Pt requesting prescription be sent today please because she is out of medication.

## 2022-12-13 NOTE — Telephone Encounter (Signed)
Called pt. Informed her of message nurse [facility] sent. Pt said thank you for calling and I will call and schedule EEG today.

## 2022-12-13 NOTE — Telephone Encounter (Signed)
Noted  

## 2022-12-13 NOTE — Telephone Encounter (Signed)
E-scribed refill.  Phone room: please call pt back and let her know rx called into pharmacy. Please also ask if she was able to set up EEG via [facility]. Victorino Dike, RMA instructed her to call them back on 11/10/22. Their phone#: [phone]

## 2022-12-13 NOTE — Telephone Encounter (Signed)
Pt has called back to report that re: her  DEPAKOTE ER 500 MG 24 hr tablet, the brand was called in.  Pt states for the past 2 years she has always had generic , and she would like to continue.  She has been informed of the cost for what was called in was $1,000.00 Pt states she has not had the medication since yesterday, and that she would like to take it this evening.

## 2022-12-13 NOTE — Addendum Note (Signed)
Addended by: Arther Abbott on: 12/13/2022 04:58 PM   Modules accepted: Orders

## 2022-12-13 NOTE — Telephone Encounter (Signed)
Pt called again. Asking if prescription for generic Depakote be sent to pharmacy today.

## 2022-12-14 NOTE — Telephone Encounter (Signed)
Called pt LVM to please call office. 

## 2023-01-09 ENCOUNTER — Telehealth: Payer: Self-pay | Admitting: Neurology

## 2023-01-09 ENCOUNTER — Other Ambulatory Visit: Payer: Self-pay

## 2023-01-09 MED ORDER — TOPIRAMATE 100 MG PO TABS: 100.0000 mg | ORAL_TABLET | Freq: Two times a day (BID) | ORAL | 3 refills | Status: DC

## 2023-01-09 NOTE — Telephone Encounter (Signed)
Pt called wanting to know why her pharmacy will not fill her topiramate (TOPAMAX) 100 MG tablet until Friday. Please advise.

## 2023-02-05 DIAGNOSIS — G40209 Localization-related (focal) (partial) symptomatic epilepsy and epileptic syndromes with complex partial seizures, not intractable, without status epilepticus: Secondary | ICD-10-CM | POA: Diagnosis not present

## 2023-02-09 ENCOUNTER — Other Ambulatory Visit: Payer: Self-pay | Admitting: Neurology

## 2023-02-09 DIAGNOSIS — G40209 Localization-related (focal) (partial) symptomatic epilepsy and epileptic syndromes with complex partial seizures, not intractable, without status epilepticus: Secondary | ICD-10-CM

## 2023-02-09 NOTE — Procedures (Signed)
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  INTERMITTENT MONITORING with VIDEO TECHNICAL SUMMARY: This AVEEG was performed using equipment provided by [employer] utilizing Bluetooth ( [employer] ) amplifiers with continuous EEGT attended video collection using encrypted remote transmission via [employer] secured cellular tower network with data rates for each AVEEG performed. This is a Therapist, music AVEEG, obtained, according to the 10-20 international electrode placement system, reformatted digitally into referential and bipolar montages. Data was acquired with a minimum of 21 bipolar connections and sampled at a minimum rate of 250 cycles per second per channel, maximum rate of 450 cycles per second per channel and two channels for EKG. The entire VEEG study was recorded through cable and or radio telemetry for subsequent analysis. Specified epochs of the AVEEG data were identified at the direction of the subject by the depression of a push button by the patient. Each patients event file included data acquired two minutes prior to the push button activation and continuing until two minutes afterwards. AVEEG files were reviewed on [facility] server, Licensed Software provided by [employer] with a digital high frequency filter set at 70 Hz and a low frequency filter set at 1 Hz with a paper speed of 3mm/s resulting in 10 seconds per digital page. This entire AVEEG was reviewed by the EEG Technologist. Random time samples, random sleep samples, clips, patient initiated push button files with included patient daily diary logs, EEG Technologist pruned data was reviewed and verified for accuracy and validity by the governing reading neurologist in full details. This AEEGV was fully compliant with all requirements for CPT 97500 for setup, patient education, take down and administered by an EEG technologist.  Long-Term EEG with  Video was monitored intermittently by a qualified EEG technologist for the entirety of the recording; quality check-ins were performed at a minimum of every two hours, checking and documenting real-time data and video to assure the integrity and quality of the recording (e.g., camera position, electrode integrity and impedance), and identify the need for maintenance. For intermittent monitoring, an EEG Technologist monitored no more than 12 patients concurrently. Diagnostic video was captured at least 80% of the time during the recording.  PATIENT EVENTS: A button press or notation was not made.  TECHNOLOGIST EVENTS: There were frequent right temporal sharps seen mainly during sleep. There was also right temporal intermittent slowing  TIME SAMPLES: 10-minutes of every two hours recorded are reviewed as random time samples.  SLEEP SAMPLES: 5-minutes of every 24 hour recorded sleep cycle are reviewed as random sleep samples.  AWAKE: At maximal level of alertness, the posterior dominant background activity was continuous, reactive, low voltage rhythm of 8.5-9.5 Hz. This was symmetric, well-modulated, and attenuated with eye opening. Diffuse, symmetric, frontocentral beta range activity was present.  SLEEP: N1 Sleep (Stage 1) was observed and characterized by the disappearance of alpha rhythm and the appearance of vertex activity. N2 Sleep (Stage 2) was observed and characterized by vertex waves, K-complexes, and sleep spindles. N3 (Stage 3) sleep was observed and characterized by high amplitude Delta activity of 20%. REM sleep was observed.  EKG: There were no arrhythmias or abnormalities noted during this recording.   Impression: This is an abnormal 72 hours ambulatory video EEG due to presence of frequent right temporal sharps and intermittent right temporal slowing. This is consistent with an area of increase epileptogenic potential and neuronal dysfunction in the right temporal region.  No seizures or events seen during this recording.   Windell Norfolk, MD [facility]

## 2023-02-12 NOTE — Progress Notes (Signed)
Please call and inform patient that her EEG showed frequent right temporal epileptiform discharges and slowing which are consistent with your known epilepsy. No seizures was seen.

## 2023-02-13 ENCOUNTER — Encounter: Payer: Self-pay | Admitting: Neurology

## 2023-03-09 ENCOUNTER — Other Ambulatory Visit: Payer: Self-pay | Admitting: Neurology

## 2023-03-12 ENCOUNTER — Other Ambulatory Visit: Payer: Self-pay

## 2023-03-12 MED ORDER — DIVALPROEX SODIUM ER 500 MG PO TB24: 1000.0000 mg | ORAL_TABLET | Freq: Every day | ORAL | 0 refills | Status: DC

## 2023-03-28 ENCOUNTER — Other Ambulatory Visit: Payer: Self-pay | Admitting: Family Medicine

## 2023-03-28 DIAGNOSIS — R7303 Prediabetes: Secondary | ICD-10-CM

## 2023-08-30 ENCOUNTER — Other Ambulatory Visit: Payer: Self-pay | Admitting: Neurology

## 2023-08-30 NOTE — Telephone Encounter (Signed)
 Last seen on 09/13/22 No follow up scheduled

## 2023-11-28 ENCOUNTER — Other Ambulatory Visit: Payer: Self-pay | Admitting: Neurology

## 2023-12-05 NOTE — Telephone Encounter (Signed)
 Pt called wanting to know when her divalproex  (DEPAKOTE  ER) 500 MG 24 hr tablet is going to be called in for her. Please advise.

## 2023-12-28 ENCOUNTER — Other Ambulatory Visit: Payer: Self-pay | Admitting: Neurology

## 2024-01-01 NOTE — Telephone Encounter (Signed)
 Pt called  to request refill . Pt states that Pharmacy  called to inform that they have not receive refill . Inform Pt that Medication is pending . Pt leave for vacation  Thursday .  topiramate  (TOPAMAX ) 100 MG tablet  Pt would like medication sent to : [id number] - [place], [place] - [address]

## 2024-01-14 NOTE — Telephone Encounter (Signed)
 Pt has been scheduled for a Medication management appt.

## 2024-01-28 ENCOUNTER — Other Ambulatory Visit: Payer: Self-pay | Admitting: Neurology

## 2024-02-18 ENCOUNTER — Ambulatory Visit: Admitting: Neurology

## 2024-02-18 ENCOUNTER — Encounter: Payer: Self-pay | Admitting: Neurology

## 2024-02-18 VITALS — BP 106/70 | HR 97 | Ht 64.96 in | Wt 150.8 lb

## 2024-02-18 DIAGNOSIS — Z5181 Encounter for therapeutic drug level monitoring: Secondary | ICD-10-CM

## 2024-02-18 DIAGNOSIS — G40209 Localization-related (focal) (partial) symptomatic epilepsy and epileptic syndromes with complex partial seizures, not intractable, without status epilepticus: Secondary | ICD-10-CM | POA: Diagnosis not present

## 2024-02-18 NOTE — Progress Notes (Signed)
 Provider:  Dedra Gores, MD  Primary Care Physician:  Kip Ade, NP [address] [place] [address]     Referring Provider: Kip Ade, Np [address] [place],  [place] [address]          Chief Complaint according to patient   Patient presents with:                HISTORY OF PRESENT ILLNESS:  Jacqueline [name] is a 53 y.o. 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  Seizure calendar  : April 6, 16, 17,   MAY 1, 8,   June; 0  July ;0  August 17 th . Yesterday.     Chief concern according to patient :   seizures are less frequent , had several seizure- free months.   No medication side effects:   D/c: Clobezam.   Depakote  continued : 1000 mg XR 24 once a day po. Or 500 mg bid po- its a large pill.    Its generic -   Topiramate   continued:  100 mg once daily.       Review of Systems: Out of a complete 14 system review, the patient complains of only the following symptoms, and all other reviewed systems are negative.:         Social History   Socioeconomic History   Marital status: Married    Spouse name: Ozell   Number of children: 2   Years of education: college   Highest education level: Not on file  Occupational History   Occupation: Inventory control    Comment: [employer]  Tobacco Use   Smoking status: Former    Current packs/day: 0.00    Types: E-cigarettes, Cigarettes    Quit date: 08/22/2016    Years since quitting: 7.4   Smokeless tobacco: Never   Tobacco comments:    quit smoking february 2018  Substance and Sexual Activity   Alcohol use: No   Drug use: No   Sexual activity: Not on file  Other Topics Concern   Not on file  Social History Narrative   Patient is married Tatiana) and lives at home with her family.   Patient has 2 children.   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   Patient has a college  education.   Patient drinks 1 cup of caffeine daily.      Social Drivers of Corporate investment banker Strain: Not on file  Food Insecurity: Not on file  Transportation Needs: Not on file  Physical Activity: Not on file  Stress: Not on file  Social Connections: Not on file    Family History  Problem Relation Age of Onset   Diabetes Father    Seizures Brother     Past Medical History:  Diagnosis Date   Seizure disorder (HCC)    Seizures (HCC)     Past Surgical History:  Procedure Laterality Date   broken nose     in high school     Current Outpatient Medications on File Prior to Visit  Medication Sig Dispense Refill   ACCU-CHEK GUIDE test strip USE TO TEST BLOOD GLUCOSE UP TO 4 TIMES A DAY 100 strip 2   Accu-Chek Softclix Lancets lancets USE AS DIRECTED UP TO 4 TIMES A DAY 100 each 1   atorvastatin  (LIPITOR) 20 MG tablet Take 1 tablet (20 mg total) by mouth  daily. 90 tablet 3   blood glucose meter kit and supplies Dispense based on patient and insurance preference. Use up to four times daily as directed. (FOR ICD-10 E10.9, E11.9). 1 each 0   Cholecalciferol (VITAMIN D3 GUMMIES ADULT) 25 MCG (1000 UT) CHEW Chew by mouth.     DEPAKOTE  ER 500 MG 24 hr tablet Take 2 tablets (1,000 mg total) by mouth daily. 180 tablet 0   divalproex  (DEPAKOTE  ER) 500 MG 24 hr tablet TAKE 2 TABLETS BY MOUTH EVERY DAY 180 tablet 0   metFORMIN  (GLUCOPHAGE ) 1000 MG tablet Take 0.5 tablets (500 mg total) by mouth daily with breakfast. 90 tablet 1   Prenatal Vit-Fe Fumarate-FA (PRENATAL VITAMIN PO) Take by mouth. One a day vitamin takes one tablet daily     Semaglutide (OZEMPIC, 0.25 OR 0.5 MG/DOSE, [place]) Inject 1 each into the skin once a week. Inject subcutaneous once a week     topiramate  (TOPAMAX ) 100 MG tablet TAKE 1 TABLET BY MOUTH TWICE A DAY 60 tablet 0   cloBAZam  (ONFI ) 10 MG tablet Take 1 tablet (10 mg total) by mouth daily. (Patient not taking: Reported on 02/18/2024) 90 tablet 1   No  current facility-administered medications on file prior to visit.    No Known Allergies   DIAGNOSTIC DATA (LABS, IMAGING, TESTING) - I reviewed patient records, labs, notes, testing and imaging myself where available.  Lab Results  Component Value Date   WBC 8.5 07/04/2022   HGB 12.5 07/04/2022   HCT 38.4 07/04/2022   MCV 87 07/04/2022   PLT 288 07/04/2022      Component Value Date/Time   NA 143 07/04/2022 0955   K 4.5 07/04/2022 0955   CL 106 07/04/2022 0955   CO2 23 07/04/2022 0955   GLUCOSE 204 (H) 07/04/2022 0955   BUN 8 07/04/2022 0955   CREATININE 0.95 07/04/2022 0955   CALCIUM  9.6 07/04/2022 0955   PROT 7.2 07/04/2022 0955   ALBUMIN 4.1 07/04/2022 0955   AST 17 07/04/2022 0955   ALT 13 07/04/2022 0955   ALKPHOS 89 07/04/2022 0955   BILITOT 0.3 07/04/2022 0955   GFRNONAA 83 10/31/2019 1420   GFRAA 96 10/31/2019 1420   Lab Results  Component Value Date   CHOL 194 10/31/2019   HDL 39 (L) 10/31/2019   LDLCALC 129 (H) 10/31/2019   TRIG 142 10/31/2019   CHOLHDL 5.0 (H) 10/31/2019   Lab Results  Component Value Date   HGBA1C 6.1 (A) 02/17/2021   No results found for: VITAMINB12 Lab Results  Component Value Date   TSH 0.737 10/31/2019    PHYSICAL EXAM:  Vitals:   02/18/24 1513  BP: 106/70  Pulse: 97   No data found. Body mass index is 25.09 kg/m.   Wt Readings from Last 3 Encounters:  02/18/24 150 lb 12.8 oz (68.4 kg)  07/04/22 173 lb (78.5 kg)  02/28/22 174 lb (78.9 kg)     Ht Readings from Last 3 Encounters:  07/04/22 5' 5 (1.651 m)  02/28/22 5' 5 (1.651 m)  10/26/21 5' 5 (1.651 m)      General: The patient is awake, alert and appears not in acute distress and groomed. Head: Normocephalic, atraumatic.  Neck is supple.    NEUROLOGIC EXAM: The patient is awake and alert, oriented to place and time.   Memory subjective described as intact.  Attention span & concentration ability appears normal.   Speech is fluent,  without   dysarthria, dysphonia or aphasia.  Mood and affect are appropriate.   Neurological Examination: Mental Status: Intact. Language and speech are normal. No cognitive deficits. Cranial Nerves II-XII: Intact. PERL. EOMI. VFF. No nystagmus.  No facial droop.  No ptosis.  Hearing is grossly intact bilaterally.  The tongue is normal and midline. Motor: Strengths are 5/5 throughout. Muscle bulk and tone are normal. No tremors.  Coordination: No ataxia or dysmetria.  Sensory: Grossly intact throughout to all modalities. Reflexes: Normal and symmetric throughout. No ankle clonus. Babinski's sign is absent bilaterally. Hoffman's sign is absent bilaterally. Gait and Station: Normal. Romberg's sign is absent.   ASSESSMENT AND PLAN :     53 y.o. year old female  here with: complex partial seizures , contributed to by poor glucose control.     1) Glucose management  by PCP / endocrinology has helped seizure frequency.   2) 3 Months seizure free from May 17 th through August 17 th.   3) RV  in 7 months with NP for seizure medications    I would like to thank NP [facility],  for allowing me to meet with this pleasant patient.   Sleep Clinic Patients are generally offered input on sleep hygiene, life style changes and how to improve compliance with medical treatment where applicable. Review and reiteration of good sleep hygiene measures is offered to any sleep clinic patient, be it in the first consultation or with any follow up visits.    Any patient with sleepiness should be cautioned not to drive, work at heights, or operate dangerous or heavy equipment when feeling tired or sleepy.      The patient will be seen in follow-up in the sleep clinic at [facility] for discussion of test results, sleep related symptoms and treatment compliance review, further management strategies, etc.   The referring provider will be notified of the test results.   The patient's condition requires  frequent monitoring and adjustments in the treatment plan, reflecting the ongoing complexity of care.  This provider is the continuing focal point for all needed services for this condition.  After spending a total time of  32  minutes face to face and time for  history taking, physical and neurologic examination, review of laboratory studies,  personal review of imaging studies, reports and results of other testing and review of referral information / records as far as provided in visit,   Electronically signed by: Dedra Gores, MD 02/18/2024 3:30 PM  [facility] Board certified by The [employer] of Sleep Medicine and Diplomate of the [employer] of Sleep Medicine. Board certified In Neurology through the ABPN, Fellow of the [employer] of Neu[employer].

## 2024-02-18 NOTE — Patient Instructions (Addendum)
 Glucose metabolism does affect level of awareness,  high blood sugar can lead to apathy and low blood sugar can lead to organ damage.   You seizures have been well controlled on 2 medications,  Topamax  and Depakote .  I will get a  VPA level today.   You can stay on the current dose as long as you do well.   You may see our NP 1 year ( yearly)  for revisit.

## 2024-02-19 LAB — VALPROIC ACID LEVEL: Valproic Acid Lvl: 51 ug/mL (ref 50–100)

## 2024-02-20 ENCOUNTER — Ambulatory Visit: Payer: Self-pay | Admitting: Neurology

## 2024-03-01 ENCOUNTER — Other Ambulatory Visit: Payer: Self-pay | Admitting: Neurology

## 2024-03-05 ENCOUNTER — Other Ambulatory Visit: Payer: Self-pay | Admitting: Neurology

## 2024-03-06 NOTE — Telephone Encounter (Signed)
 Pt called to follow up  on Medication request . Pt states Pharmacy has called Pt to informed they have not heard back From MD , Informed Pt medication is Pending, Pt states as of today she is out of Medication

## 2024-03-07 ENCOUNTER — Other Ambulatory Visit: Payer: Self-pay | Admitting: Neurology

## 2024-03-07 MED ORDER — TOPIRAMATE 100 MG PO TABS: 100.0000 mg | ORAL_TABLET | Freq: Two times a day (BID) | ORAL | 11 refills | Status: AC

## 2024-03-07 NOTE — Telephone Encounter (Signed)
 Pt called again wanting to know the update. Pt is out and is needing this filled as soon as possible. Please advise.

## 2024-06-08 ENCOUNTER — Other Ambulatory Visit: Payer: Self-pay | Admitting: Neurology

## 2025-01-12 ENCOUNTER — Ambulatory Visit: Admitting: Family Medicine
